# Patient Record
Sex: Female | Born: 1980
Health system: Southern US, Community
[De-identification: ages and names within clinical notes are randomized; demographics above are authoritative.]

## PROBLEM LIST (undated history)

## (undated) DIAGNOSIS — Z789 Other specified health status: Secondary | ICD-10-CM

## (undated) DIAGNOSIS — N979 Female infertility, unspecified: Secondary | ICD-10-CM

## (undated) HISTORY — PX: TONSILLECTOMY AND ADENOIDECTOMY: SUR1326

## (undated) HISTORY — PX: WISDOM TOOTH EXTRACTION: SHX21

## (undated) HISTORY — PX: CYST REMOVAL TRUNK: SHX6283

---

## 2014-01-04 ENCOUNTER — Encounter (INDEPENDENT_AMBULATORY_CARE_PROVIDER_SITE_OTHER): Payer: Self-pay | Admitting: General Surgery

## 2014-01-19 ENCOUNTER — Ambulatory Visit (INDEPENDENT_AMBULATORY_CARE_PROVIDER_SITE_OTHER): Payer: Commercial Managed Care - PPO | Admitting: General Surgery

## 2014-02-07 ENCOUNTER — Encounter (INDEPENDENT_AMBULATORY_CARE_PROVIDER_SITE_OTHER): Payer: Self-pay | Admitting: General Surgery

## 2014-02-07 ENCOUNTER — Ambulatory Visit (INDEPENDENT_AMBULATORY_CARE_PROVIDER_SITE_OTHER): Payer: Commercial Managed Care - PPO | Admitting: General Surgery

## 2014-02-07 VITALS — BP 136/70 | HR 107 | Temp 98.9°F | Ht 70.0 in | Wt 217.0 lb

## 2014-02-07 DIAGNOSIS — R222 Localized swelling, mass and lump, trunk: Secondary | ICD-10-CM | POA: Insufficient documentation

## 2014-02-07 DIAGNOSIS — R229 Localized swelling, mass and lump, unspecified: Secondary | ICD-10-CM

## 2014-02-07 NOTE — Progress Notes (Signed)
Patient ID: Stephanie Delgado, female   DOB: [birth year], 33 y.o.   MRN: [id number]  Chief Complaint  Patient presents with  . Cyst    HPI Stephanie Delgado is a 33 y.o. female.  HPI 33 year old Caucasian female referred by Dr. Leo Grosser for evaluation of an upper back mass. The patient states it is been present for least 10 years however it is slowly been increasing in size. It has never been infected or drained any fluid. However she is concerned by the fact it is increasing in size. She did have a cyst removed from the right side of her face as a small child. She denies any weight change. She denies any lymphadenopathy. She denies any fevers or chills. She denies any trauma to the area. She states that her grandparents both had lumps that were removed over the course of many years. To her knowledge they were nonmalignant Past Medical History  Diagnosis Date  . Hypertension   . Thyroid disease     Past Surgical History  Procedure Laterality Date  . Tonsillectomy and adenoidectomy    . Wisdom tooth extraction      History reviewed. No pertinent family history.  Social History History  Substance Use Topics  . Smoking status: Never Smoker   . Smokeless tobacco: Not on file  . Alcohol Use: No    No Known Allergies  Current Outpatient Prescriptions  Medication Sig Dispense Refill  . levothyroxine (SYNTHROID, LEVOTHROID) 25 MCG tablet Take 25 mcg by mouth daily before breakfast. Per Pt Dosage unknown      . Inositol-Folic Acid (PREGNITUDE) 2000-200 MG-MCG PACK Take by mouth.      . Prenatal Vit-Fe Fumarate-FA (PRENATAL MULTIVITAMIN) TABS tablet Take 1 tablet by mouth daily at 12 noon.       No current facility-administered medications for this visit.    Review of Systems Review of Systems  Constitutional: Negative for fever, activity change, appetite change and unexpected weight change.  HENT: Negative for nosebleeds and trouble swallowing.   Eyes: Negative for photophobia and  visual disturbance.  Respiratory: Negative for chest tightness and shortness of breath.   Cardiovascular: Negative for chest pain and leg swelling.       Denies CP, SOB, DOE  Genitourinary: Negative for dysuria and difficulty urinating.  Musculoskeletal: Negative for arthralgias.  Skin: Negative for pallor and rash.  Neurological: Negative for dizziness, seizures, facial asymmetry and numbness.          Hematological: Negative for adenopathy. Does not bruise/bleed easily.  Psychiatric/Behavioral: Negative for behavioral problems and agitation.    Blood pressure 136/70, pulse 107, temperature 98.9 F (37.2 C), temperature source Oral, height 5\' 10"  (1.778 m), weight 217 lb (98.431 kg), SpO2 98.00%.  Physical Exam Physical Exam  Vitals reviewed. Constitutional: She is oriented to person, place, and time. She appears well-developed and well-nourished. No distress.  HENT:  Head: Normocephalic and atraumatic.  Right Ear: External ear normal.  Left Ear: External ear normal.  Eyes: Conjunctivae are normal. No scleral icterus.  Neck: Normal range of motion. Neck supple. No tracheal deviation present. No thyromegaly present.  Cardiovascular: Normal rate and normal heart sounds.   Pulmonary/Chest: Effort normal and breath sounds normal. No stridor. No respiratory distress. She has no wheezes.    Upper back subcutaneous mass 3 cm x 2 cm, well-circumscribed, soft, nontender, no cellulitis, induration, or fluctuance. Mobile  Musculoskeletal: She exhibits no edema and no tenderness.  Lymphadenopathy:    She has no cervical  adenopathy.  Neurological: She is alert and oriented to person, place, and time. She exhibits normal muscle tone.  Skin: Skin is warm and dry. No rash noted. She is not diaphoretic. No erythema.  Psychiatric: She has a normal mood and affect. Her behavior is normal. Judgment and thought content normal.    Data Reviewed None   Assessment    Upper back subcutaneous  mass      Plan    This is most consistent with either a sebaceous cyst or lipoma. Irregardless it is a benign lesion.  We discussed the etiology and management of sebaceous cysts (epidermoid inclusion cysts) & lipomas. The patient was given educational material. We discussed that these lesions can become infected at times. We discussed the signs and symptoms of infection. We discussed the only way to eliminate these lesions is to surgically excise the cyst and its wall in its entirety.   We discussed observation versus surgical excision. We discussed the risks and benefits of surgery including but not limited to bleeding, infection, injury to surrounding structures, scarring, cosmetic concerns, possible temporary drain placement, blood clot formation, anesthesia issues, possible recurrence, and the typical postoperative course.   The patient has elected to to have the area excised. Our office schedule she will contact her to schedule surgery in the near future  Note: This dictation was prepared with Dragon/digital dictation along with [employer]. Any transcriptional errors that result from this process are unintentional.  Leighton Ruff. Redmond Pulling, MD, FACS General, Bariatric, & Minimally Invasive Surgery [facility], [place]          [facility] M 02/07/2014, 4:37 PM

## 2014-02-07 NOTE — Patient Instructions (Signed)
Our office will contact you to schedule surgery in the next few days. Please call if my office has not contacted you by Friday  Lipoma A lipoma is a noncancerous (benign) tumor composed of fat cells. They are usually found under the skin (subcutaneous). A lipoma may occur in any tissue of the body that contains fat. Common areas for lipomas to appear include the back, shoulders, buttocks, and thighs. Lipomas are a very common soft tissue growth. They are soft and grow slowly. Most problems caused by a lipoma depend on where it is growing. DIAGNOSIS  A lipoma can be diagnosed with a physical exam. These tumors rarely become cancerous, but radiographic studies can help determine this for certain. Studies used may include:  Computerized X-ray scans (CT or CAT scan).  Computerized magnetic scans (MRI). TREATMENT  Small lipomas that are not causing problems may be watched. If a lipoma continues to enlarge or causes problems, removal is often the best treatment. Lipomas can also be removed to improve appearance. Surgery is done to remove the fatty cells and the surrounding capsule. Most often, this is done with medicine that numbs the area (local anesthetic). The removed tissue is examined under a microscope to make sure it is not cancerous. Keep all follow-up appointments with your caregiver. SEEK MEDICAL CARE IF:   The lipoma becomes larger or hard.  The lipoma becomes painful, red, or increasingly swollen. These could be signs of infection or a more serious condition. Document Released: 06/07/2002 Document Revised: 09/09/2011 Document Reviewed: 11/17/2009 [facility] Patient Information 2015 [place], [place]. This information is not intended to replace advice given to you by your health care provider. Make sure you discuss any questions you have with your health care provider.   Epidermal Cyst An epidermal cyst is sometimes called a sebaceous cyst, epidermal inclusion cyst, or infundibular cyst. These  cysts usually contain a substance that looks "pasty" or "cheesy" and may have a bad smell. This substance is a protein called keratin. Epidermal cysts are usually found on the face, neck, or trunk. They may also occur in the vaginal area or other parts of the genitalia of both men and women. Epidermal cysts are usually small, painless, slow-growing bumps or lumps that move freely under the skin. It is important not to try to pop them. This may cause an infection and lead to tenderness and swelling. CAUSES  Epidermal cysts may be caused by a deep penetrating injury to the skin or a plugged hair follicle, often associated with acne. SYMPTOMS  Epidermal cysts can become inflamed and cause:  Redness.  Tenderness.  Increased temperature of the skin over the bumps or lumps.  Grayish-white, bad smelling material that drains from the bump or lump. DIAGNOSIS  Epidermal cysts are easily diagnosed by your caregiver during an exam. Rarely, a tissue sample (biopsy) may be taken to rule out other conditions that may resemble epidermal cysts. TREATMENT   Epidermal cysts often get better and disappear on their own. They are rarely ever cancerous.  If a cyst becomes infected, it may become inflamed and tender. This may require opening and draining the cyst. Treatment with antibiotics may be necessary. When the infection is gone, the cyst may be removed with minor surgery.  Small, inflamed cysts can often be treated with antibiotics or by injecting steroid medicines.  Sometimes, epidermal cysts become large and bothersome. If this happens, surgical removal in your caregiver's office may be necessary. HOME CARE INSTRUCTIONS  Only take over-the-counter or prescription medicines  as directed by your caregiver.  Take your antibiotics as directed. Finish them even if you start to feel better. SEEK MEDICAL CARE IF:   Your cyst becomes tender, red, or swollen.  Your condition is not improving or is getting  worse.  You have any other questions or concerns. MAKE SURE YOU:  Understand these instructions.  Will watch your condition.  Will get help right away if you are not doing well or get worse. Document Released: 05/18/2004 Document Revised: 09/09/2011 Document Reviewed: 12/24/2010 [facility] Patient Information 2015 [place], [place]. This information is not intended to replace advice given to you by your health care provider. Make sure you discuss any questions you have with your health care provider.

## 2014-04-04 ENCOUNTER — Other Ambulatory Visit (HOSPITAL_COMMUNITY): Payer: Self-pay | Admitting: Obstetrics and Gynecology

## 2014-04-04 DIAGNOSIS — N979 Female infertility, unspecified: Secondary | ICD-10-CM

## 2014-04-07 ENCOUNTER — Ambulatory Visit (HOSPITAL_COMMUNITY)
Admission: RE | Admit: 2014-04-07 | Discharge: 2014-04-07 | Disposition: A | Discharge status: Home / Self Care | Payer: 59 | Source: Ambulatory Visit | Attending: Obstetrics and Gynecology | Admitting: Obstetrics and Gynecology

## 2014-04-07 ENCOUNTER — Encounter (INDEPENDENT_AMBULATORY_CARE_PROVIDER_SITE_OTHER): Payer: Self-pay

## 2014-04-07 DIAGNOSIS — N979 Female infertility, unspecified: Secondary | ICD-10-CM | POA: Diagnosis present

## 2014-04-07 MED ORDER — IOHEXOL 300 MG/ML  SOLN
20.0000 mL | Freq: Once | INTRAMUSCULAR | Status: AC | PRN
  Administered 2014-04-07: 20 mL

## 2015-07-04 DIAGNOSIS — R070 Pain in throat: Secondary | ICD-10-CM | POA: Diagnosis not present

## 2015-07-04 DIAGNOSIS — K219 Gastro-esophageal reflux disease without esophagitis: Secondary | ICD-10-CM | POA: Diagnosis not present

## 2015-07-04 DIAGNOSIS — Z91018 Allergy to other foods: Secondary | ICD-10-CM | POA: Diagnosis not present

## 2015-07-11 DIAGNOSIS — E538 Deficiency of other specified B group vitamins: Secondary | ICD-10-CM | POA: Diagnosis not present

## 2015-08-08 DIAGNOSIS — E538 Deficiency of other specified B group vitamins: Secondary | ICD-10-CM | POA: Diagnosis not present

## 2015-08-21 MED FILL — SAXENDA 18 MG/3 ML PEN: 18 | 84 days supply | Qty: 45 | Fill #1

## 2015-08-29 DIAGNOSIS — E538 Deficiency of other specified B group vitamins: Secondary | ICD-10-CM | POA: Diagnosis not present

## 2015-08-31 DIAGNOSIS — E538 Deficiency of other specified B group vitamins: Secondary | ICD-10-CM | POA: Diagnosis not present

## 2015-08-31 DIAGNOSIS — R202 Paresthesia of skin: Secondary | ICD-10-CM | POA: Diagnosis not present

## 2015-08-31 DIAGNOSIS — M25561 Pain in right knee: Secondary | ICD-10-CM | POA: Diagnosis not present

## 2015-08-31 DIAGNOSIS — G8929 Other chronic pain: Secondary | ICD-10-CM | POA: Diagnosis not present

## 2015-08-31 DIAGNOSIS — F418 Other specified anxiety disorders: Secondary | ICD-10-CM | POA: Diagnosis not present

## 2015-08-31 DIAGNOSIS — E6609 Other obesity due to excess calories: Secondary | ICD-10-CM | POA: Diagnosis not present

## 2015-08-31 DIAGNOSIS — Z6831 Body mass index (BMI) 31.0-31.9, adult: Secondary | ICD-10-CM | POA: Diagnosis not present

## 2015-10-31 DIAGNOSIS — E538 Deficiency of other specified B group vitamins: Secondary | ICD-10-CM | POA: Diagnosis not present

## 2015-10-31 DIAGNOSIS — F419 Anxiety disorder, unspecified: Secondary | ICD-10-CM | POA: Diagnosis not present

## 2015-10-31 DIAGNOSIS — E6609 Other obesity due to excess calories: Secondary | ICD-10-CM | POA: Diagnosis not present

## 2015-10-31 DIAGNOSIS — M238X2 Other internal derangements of left knee: Secondary | ICD-10-CM | POA: Diagnosis not present

## 2015-11-13 MED FILL — CONTRAVE ER 8-90 MG TABLET: 8-90 | 30 days supply | Qty: 120 | Fill #0

## 2015-12-18 MED FILL — SAXENDA 18 MG/3 ML PEN: 18 | 84 days supply | Qty: 45 | Fill #2

## 2016-01-09 DIAGNOSIS — M25562 Pain in left knee: Secondary | ICD-10-CM | POA: Diagnosis not present

## 2016-01-09 DIAGNOSIS — E538 Deficiency of other specified B group vitamins: Secondary | ICD-10-CM | POA: Diagnosis not present

## 2016-01-09 DIAGNOSIS — G8929 Other chronic pain: Secondary | ICD-10-CM | POA: Diagnosis not present

## 2016-01-09 MED FILL — CYANOCOBALAMIN 1,000 MCG/ML: 1000 | 90 days supply | Qty: 3 | Fill #0

## 2016-01-09 MED FILL — TERBINAFINE HCL 250 MG TAB: 250 | 30 days supply | Qty: 30 | Fill #0

## 2016-01-11 DIAGNOSIS — M25561 Pain in right knee: Secondary | ICD-10-CM | POA: Diagnosis not present

## 2016-01-11 DIAGNOSIS — M25562 Pain in left knee: Secondary | ICD-10-CM | POA: Diagnosis not present

## 2016-01-11 DIAGNOSIS — S83412A Sprain of medial collateral ligament of left knee, initial encounter: Secondary | ICD-10-CM | POA: Diagnosis not present

## 2016-01-11 MED FILL — METHYLPREDNISOLONE 4 MG TAB: 4 | 6 days supply | Qty: 21 | Fill #0

## 2016-01-13 DIAGNOSIS — H6123 Impacted cerumen, bilateral: Secondary | ICD-10-CM | POA: Diagnosis not present

## 2016-01-13 DIAGNOSIS — H9313 Tinnitus, bilateral: Secondary | ICD-10-CM | POA: Diagnosis not present

## 2016-02-05 DIAGNOSIS — Z79899 Other long term (current) drug therapy: Secondary | ICD-10-CM | POA: Diagnosis not present

## 2016-02-05 DIAGNOSIS — E538 Deficiency of other specified B group vitamins: Secondary | ICD-10-CM | POA: Diagnosis not present

## 2016-03-11 DIAGNOSIS — E538 Deficiency of other specified B group vitamins: Secondary | ICD-10-CM | POA: Diagnosis not present

## 2016-03-12 MED FILL — SAXENDA 18 MG/3 ML PEN: 18 | 84 days supply | Qty: 45 | Fill #3

## 2016-03-15 DIAGNOSIS — D171 Benign lipomatous neoplasm of skin and subcutaneous tissue of trunk: Secondary | ICD-10-CM | POA: Diagnosis not present

## 2016-03-28 DIAGNOSIS — L814 Other melanin hyperpigmentation: Secondary | ICD-10-CM | POA: Diagnosis not present

## 2016-03-28 DIAGNOSIS — D225 Melanocytic nevi of trunk: Secondary | ICD-10-CM | POA: Diagnosis not present

## 2016-03-28 DIAGNOSIS — L700 Acne vulgaris: Secondary | ICD-10-CM | POA: Diagnosis not present

## 2016-04-10 DIAGNOSIS — E538 Deficiency of other specified B group vitamins: Secondary | ICD-10-CM | POA: Diagnosis not present

## 2016-04-10 DIAGNOSIS — Z6829 Body mass index (BMI) 29.0-29.9, adult: Secondary | ICD-10-CM | POA: Diagnosis not present

## 2016-04-10 DIAGNOSIS — R208 Other disturbances of skin sensation: Secondary | ICD-10-CM | POA: Diagnosis not present

## 2016-04-10 DIAGNOSIS — B351 Tinea unguium: Secondary | ICD-10-CM | POA: Diagnosis not present

## 2016-04-10 DIAGNOSIS — E669 Obesity, unspecified: Secondary | ICD-10-CM | POA: Diagnosis not present

## 2016-05-27 MED FILL — SAXENDA 18 MG/3 ML PEN: 18 | 84 days supply | Qty: 45 | Fill #4

## 2016-05-30 DIAGNOSIS — L723 Sebaceous cyst: Secondary | ICD-10-CM | POA: Diagnosis not present

## 2016-05-30 DIAGNOSIS — L720 Epidermal cyst: Secondary | ICD-10-CM | POA: Diagnosis not present

## 2016-07-11 DIAGNOSIS — E669 Obesity, unspecified: Secondary | ICD-10-CM | POA: Diagnosis not present

## 2016-07-11 DIAGNOSIS — E538 Deficiency of other specified B group vitamins: Secondary | ICD-10-CM | POA: Diagnosis not present

## 2016-07-16 DIAGNOSIS — E538 Deficiency of other specified B group vitamins: Secondary | ICD-10-CM | POA: Diagnosis not present

## 2016-07-16 DIAGNOSIS — E669 Obesity, unspecified: Secondary | ICD-10-CM | POA: Diagnosis not present

## 2016-07-16 DIAGNOSIS — R634 Abnormal weight loss: Secondary | ICD-10-CM | POA: Diagnosis not present

## 2016-09-23 DIAGNOSIS — M6740 Ganglion, unspecified site: Secondary | ICD-10-CM | POA: Diagnosis not present

## 2017-04-16 MED FILL — diazePAM 5 MG TABS: 5 | 10 days supply | Qty: 10 | Fill #0

## 2017-04-29 DIAGNOSIS — N979 Female infertility, unspecified: Secondary | ICD-10-CM | POA: Diagnosis not present

## 2017-04-29 DIAGNOSIS — N926 Irregular menstruation, unspecified: Secondary | ICD-10-CM | POA: Diagnosis not present

## 2017-04-29 DIAGNOSIS — Z3040 Encounter for surveillance of contraceptives, unspecified: Secondary | ICD-10-CM | POA: Diagnosis not present

## 2017-05-09 DIAGNOSIS — N926 Irregular menstruation, unspecified: Secondary | ICD-10-CM | POA: Diagnosis not present

## 2017-05-09 DIAGNOSIS — N979 Female infertility, unspecified: Secondary | ICD-10-CM | POA: Diagnosis not present

## 2017-05-09 DIAGNOSIS — Z331 Pregnant state, incidental: Secondary | ICD-10-CM | POA: Diagnosis not present

## 2017-05-09 DIAGNOSIS — O3680X1 Pregnancy with inconclusive fetal viability, fetus 1: Secondary | ICD-10-CM | POA: Diagnosis not present

## 2017-05-09 DIAGNOSIS — Z3A01 Less than 8 weeks gestation of pregnancy: Secondary | ICD-10-CM | POA: Diagnosis not present

## 2017-05-29 DIAGNOSIS — Z113 Encounter for screening for infections with a predominantly sexual mode of transmission: Secondary | ICD-10-CM | POA: Diagnosis not present

## 2017-05-29 DIAGNOSIS — Z3491 Encounter for supervision of normal pregnancy, unspecified, first trimester: Secondary | ICD-10-CM | POA: Diagnosis not present

## 2017-05-29 LAB — OB RESULTS CONSOLE HIV ANTIBODY (ROUTINE TESTING): HIV: NONREACTIVE

## 2017-05-29 LAB — OB RESULTS CONSOLE HEPATITIS B SURFACE ANTIGEN: HEP B S AG: NEGATIVE

## 2017-05-29 LAB — OB RESULTS CONSOLE RPR: RPR: NONREACTIVE

## 2017-05-29 LAB — OB RESULTS CONSOLE GC/CHLAMYDIA
Chlamydia: NEGATIVE
Gonorrhea: NEGATIVE

## 2017-05-29 LAB — OB RESULTS CONSOLE RUBELLA ANTIBODY, IGM: Rubella: IMMUNE

## 2017-05-29 LAB — OB RESULTS CONSOLE ANTIBODY SCREEN: Antibody Screen: NEGATIVE

## 2017-05-29 LAB — OB RESULTS CONSOLE ABO/RH: RH TYPE: POSITIVE

## 2017-06-17 DIAGNOSIS — Z3491 Encounter for supervision of normal pregnancy, unspecified, first trimester: Secondary | ICD-10-CM | POA: Diagnosis not present

## 2017-06-17 DIAGNOSIS — Z3A12 12 weeks gestation of pregnancy: Secondary | ICD-10-CM | POA: Diagnosis not present

## 2017-06-17 DIAGNOSIS — Z3481 Encounter for supervision of other normal pregnancy, first trimester: Secondary | ICD-10-CM | POA: Diagnosis not present

## 2017-06-17 DIAGNOSIS — Z36 Encounter for antenatal screening for chromosomal anomalies: Secondary | ICD-10-CM | POA: Diagnosis not present

## 2017-06-17 DIAGNOSIS — O3680X9 Pregnancy with inconclusive fetal viability, other fetus: Secondary | ICD-10-CM | POA: Diagnosis not present

## 2017-07-16 DIAGNOSIS — Z3A16 16 weeks gestation of pregnancy: Secondary | ICD-10-CM | POA: Diagnosis not present

## 2017-07-16 DIAGNOSIS — R631 Polydipsia: Secondary | ICD-10-CM | POA: Diagnosis not present

## 2017-08-15 DIAGNOSIS — Z369 Encounter for antenatal screening, unspecified: Secondary | ICD-10-CM | POA: Diagnosis not present

## 2017-08-15 DIAGNOSIS — O43129 Velamentous insertion of umbilical cord, unspecified trimester: Secondary | ICD-10-CM | POA: Diagnosis not present

## 2017-08-15 DIAGNOSIS — Z3491 Encounter for supervision of normal pregnancy, unspecified, first trimester: Secondary | ICD-10-CM | POA: Diagnosis not present

## 2017-08-15 DIAGNOSIS — Z3A20 20 weeks gestation of pregnancy: Secondary | ICD-10-CM | POA: Diagnosis not present

## 2017-08-15 DIAGNOSIS — Z3402 Encounter for supervision of normal first pregnancy, second trimester: Secondary | ICD-10-CM | POA: Diagnosis not present

## 2017-08-15 DIAGNOSIS — Z363 Encounter for antenatal screening for malformations: Secondary | ICD-10-CM | POA: Diagnosis not present

## 2017-09-12 DIAGNOSIS — O43129 Velamentous insertion of umbilical cord, unspecified trimester: Secondary | ICD-10-CM | POA: Diagnosis not present

## 2017-09-12 DIAGNOSIS — Z3A24 24 weeks gestation of pregnancy: Secondary | ICD-10-CM | POA: Diagnosis not present

## 2017-10-14 DIAGNOSIS — Z3403 Encounter for supervision of normal first pregnancy, third trimester: Secondary | ICD-10-CM | POA: Diagnosis not present

## 2017-10-14 DIAGNOSIS — Z3A29 29 weeks gestation of pregnancy: Secondary | ICD-10-CM | POA: Diagnosis not present

## 2017-10-14 DIAGNOSIS — O43129 Velamentous insertion of umbilical cord, unspecified trimester: Secondary | ICD-10-CM | POA: Diagnosis not present

## 2017-10-28 DIAGNOSIS — Z3A31 31 weeks gestation of pregnancy: Secondary | ICD-10-CM | POA: Diagnosis not present

## 2017-10-28 DIAGNOSIS — Z23 Encounter for immunization: Secondary | ICD-10-CM | POA: Diagnosis not present

## 2017-10-28 DIAGNOSIS — Z3493 Encounter for supervision of normal pregnancy, unspecified, third trimester: Secondary | ICD-10-CM | POA: Diagnosis not present

## 2017-10-29 ENCOUNTER — Other Ambulatory Visit (HOSPITAL_COMMUNITY): Payer: Self-pay | Admitting: Obstetrics and Gynecology

## 2017-10-29 DIAGNOSIS — Z3689 Encounter for other specified antenatal screening: Secondary | ICD-10-CM

## 2017-11-12 ENCOUNTER — Encounter (HOSPITAL_COMMUNITY): Payer: Self-pay | Admitting: *Deleted

## 2017-11-13 ENCOUNTER — Other Ambulatory Visit (HOSPITAL_COMMUNITY): Payer: Self-pay | Admitting: Obstetrics and Gynecology

## 2017-11-13 ENCOUNTER — Ambulatory Visit (HOSPITAL_COMMUNITY)
Admission: RE | Admit: 2017-11-13 | Discharge: 2017-11-13 | Disposition: A | Discharge status: Home / Self Care | Payer: 59 | Source: Ambulatory Visit | Attending: Obstetrics and Gynecology | Admitting: Obstetrics and Gynecology

## 2017-11-13 DIAGNOSIS — Z363 Encounter for antenatal screening for malformations: Secondary | ICD-10-CM

## 2017-11-13 DIAGNOSIS — O0933 Supervision of pregnancy with insufficient antenatal care, third trimester: Secondary | ICD-10-CM

## 2017-11-13 DIAGNOSIS — Z3689 Encounter for other specified antenatal screening: Secondary | ICD-10-CM

## 2017-11-13 DIAGNOSIS — Z3A33 33 weeks gestation of pregnancy: Secondary | ICD-10-CM

## 2017-11-13 DIAGNOSIS — Z362 Encounter for other antenatal screening follow-up: Secondary | ICD-10-CM | POA: Insufficient documentation

## 2017-11-13 IMAGING — US US MFM OB COMP +14 WKS
1 series · 14 of 28 positions shown · non-contrast
Comparison: none

[Series 1: us mfm ob comp +14 wks · 71 acquisitions, 14 frames shown]
[im 3/71]
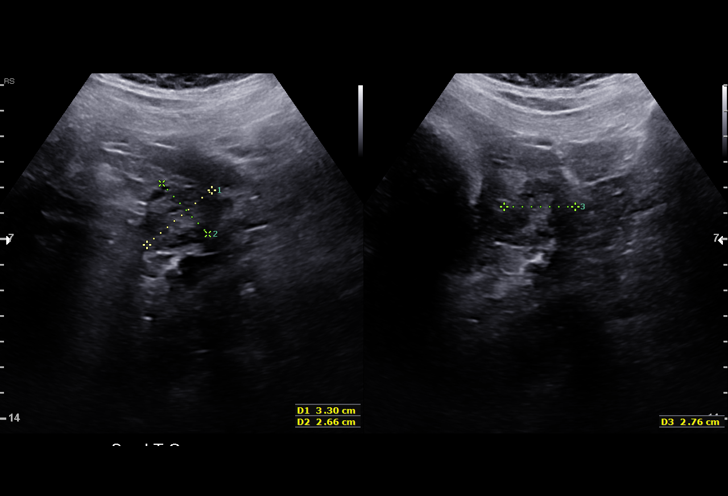
[im 8/71]
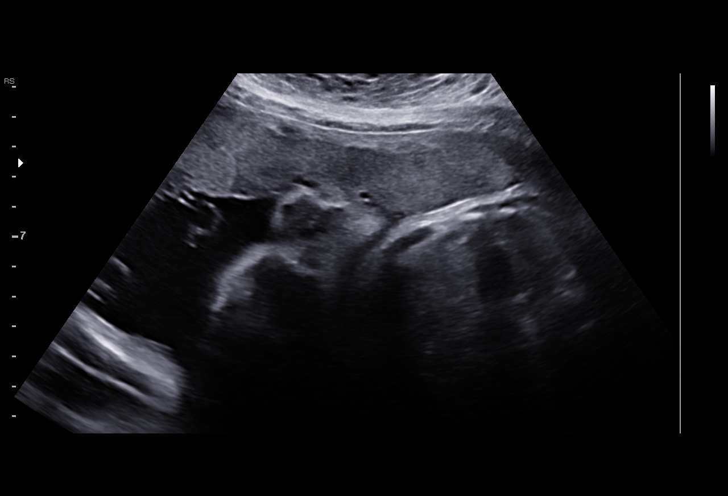
[im 13/71]
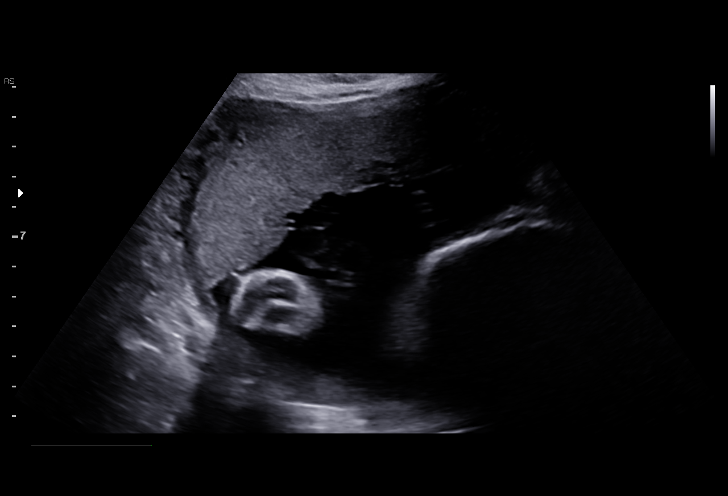
[im 19/71]
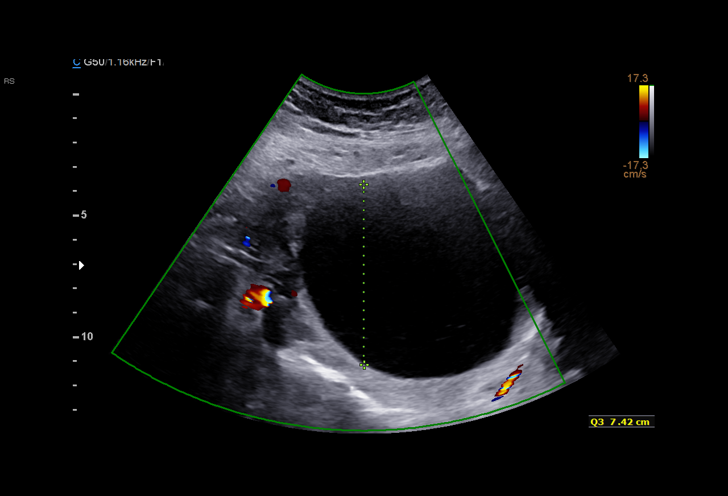
[im 24/71]
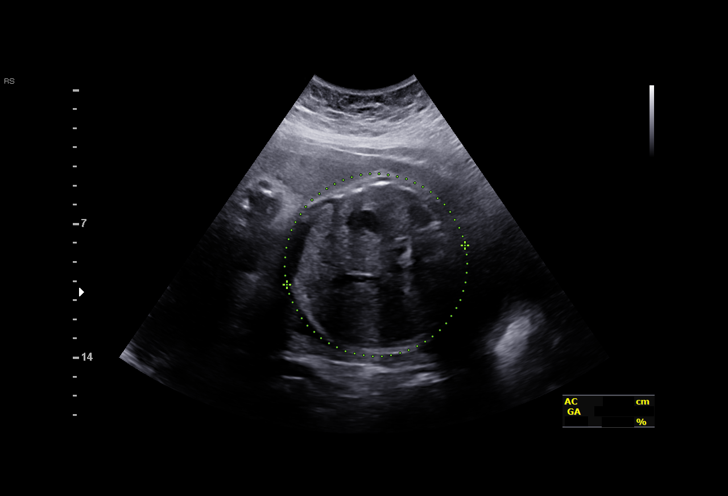
[im 29/71]
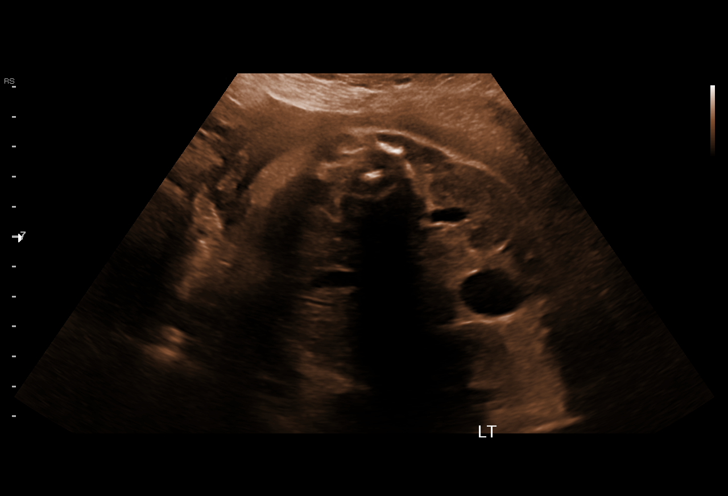
[im 34/71]
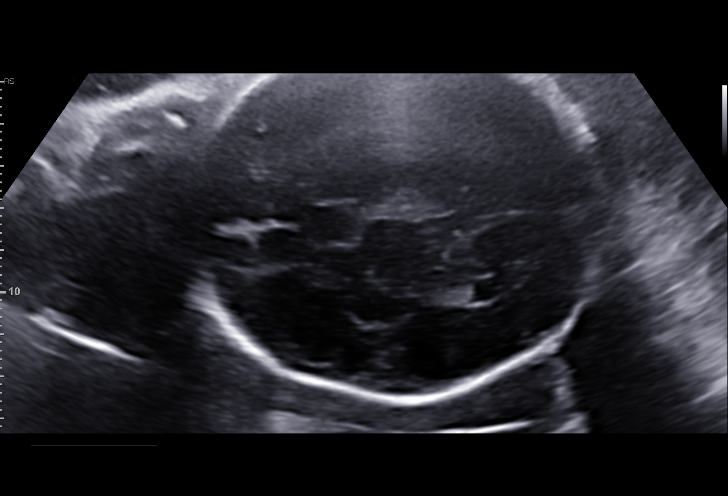
[im 39/71]
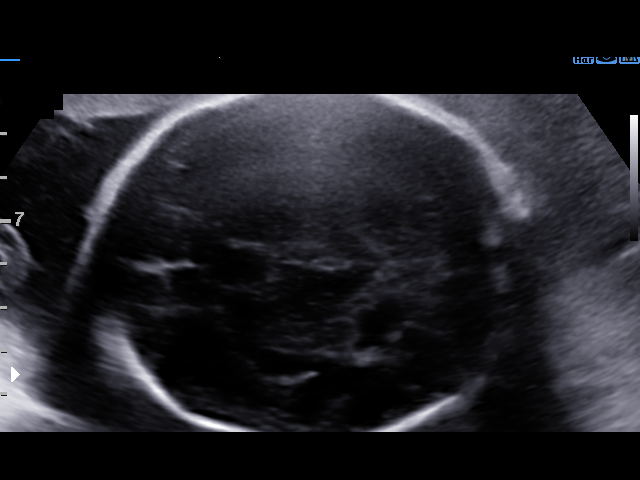
[im 45/71]
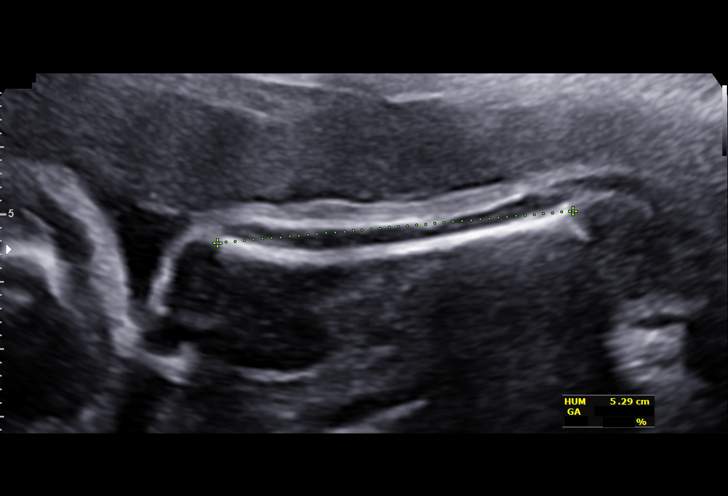
[im 50/71]
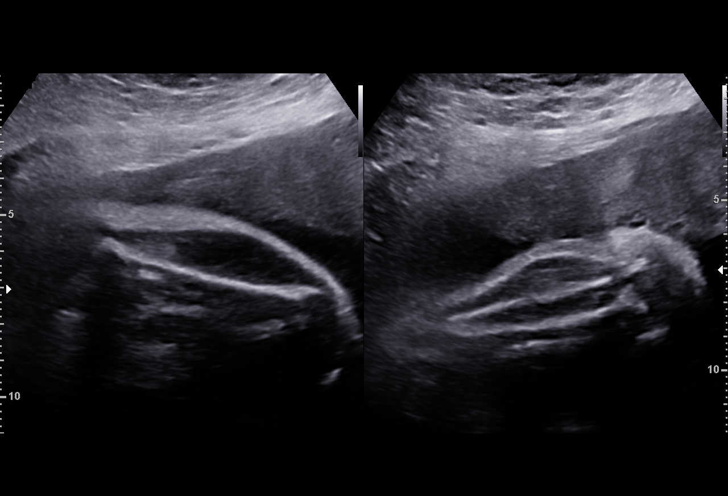
[im 55/71]
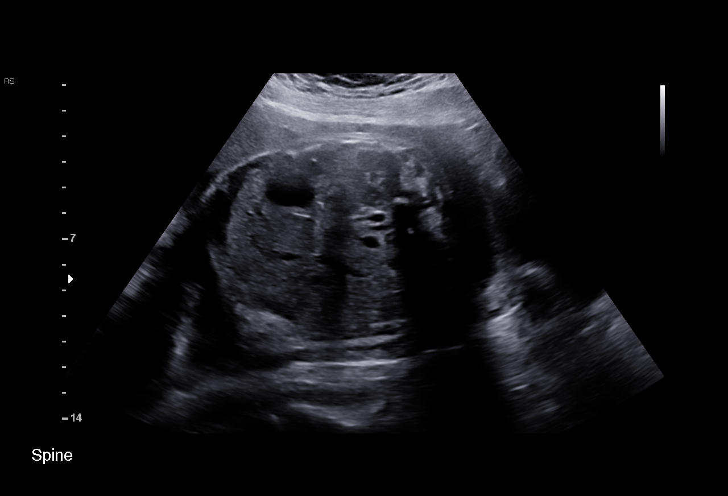
[im 60/71]
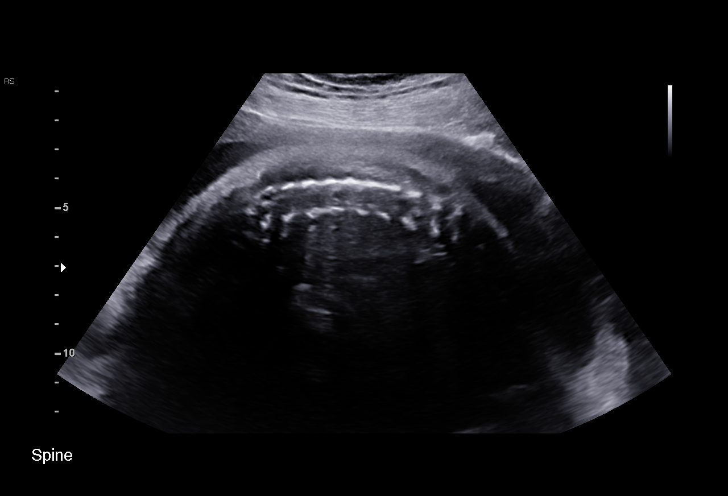
[im 65/71]
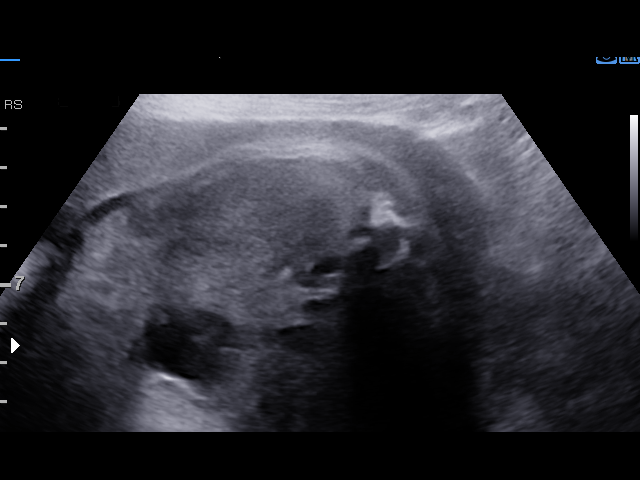
[im 71/71]
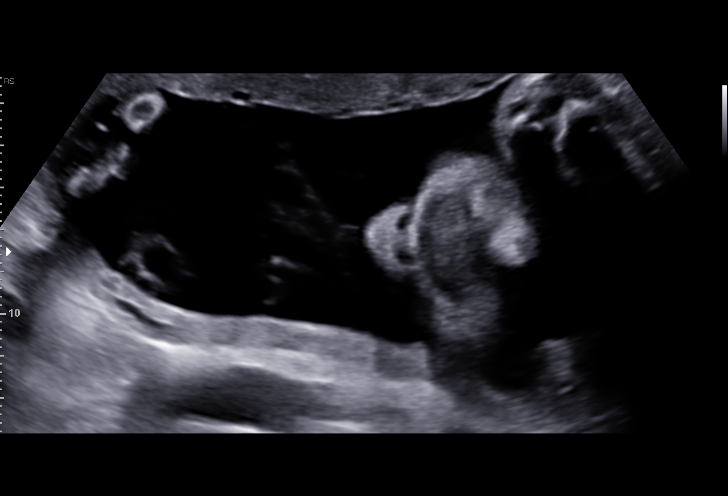

[14 of 28 positions shown; findings below may reference images not displayed]

Obstetrics &
Gynecology
[9H] ICHUUN
ICHUUN.

1  ICHUUN            [PHONE_NUMBER]      [PHONE_NUMBER]     [PHONE_NUMBER]
Indications

33 weeks gestation of pregnancy
OB History

Gravidity:    1
Fetal Evaluation

Num Of Fetuses:     1
Fetal Heart         154
Rate(bpm):
Cardiac Activity:   Observed
Presentation:       Transverse, head to maternal left
Placenta:           Anterior, above cervical os
P. Cord Insertion:  Visualized

Amniotic Fluid
AFI FV:      Subjectively within normal limits

AFI Sum(cm)     %Tile       Largest Pocket(cm)
19.19           71

RUQ(cm)       RLQ(cm)       LUQ(cm)        LLQ(cm)
3.34
Biometry
BPD:      84.3  mm     G. Age:  33w 6d         65  %    CI:        77.24   %    70 - 86
FL/HC:      19.9   %    19.9 -
HC:      303.7  mm     G. Age:  33w 5d         25  %    HC/AC:      1.01        0.96 -
AC:      301.9  mm     G. Age:  34w 1d         76  %    FL/BPD:     71.5   %    71 - 87
FL:       60.3  mm     G. Age:  31w 2d          5  %    FL/AC:      20.0   %    20 - 24
HUM:      52.7  mm     G. Age:  30w 5d          6  %
CER:      42.7  mm     G. Age:  36w 5d         88  %

Est. FW:    [9H]  gm    4 lb 12 oz      59  %
Gestational Age

LMP:           33w 2d        Date:  [DATE]                 EDD:   [DATE]
U/S Today:     33w 2d                                        EDD:   [DATE]
Best:          33w 2d     Det. By:  LMP  ([DATE])          EDD:   [DATE]
Anatomy

Cranium:               Appears normal         Aortic Arch:            Appears normal
Cavum:                 Appears normal         Ductal Arch:            Not well visualized
Ventricles:            Appears normal         Diaphragm:              Not well visualized
Choroid Plexus:        Not well visualized    Stomach:                Appears normal, left
sided
Cerebellum:            Appears normal         Abdomen:                Appears normal
Posterior Fossa:       Not well visualized    Abdominal Wall:         Not well visualized
Nuchal Fold:           Not applicable (>20    Cord Vessels:           Appears normal (3
wks GA)                                        vessel cord)
Face:                  Not well visualized    Kidneys:                Appear normal
Lips:                  Appears normal         Bladder:                Appears normal
Thoracic:              Appears normal         Spine:                  Not well visualized
Heart:                 Not well visualized    Upper Extremities:      LUE vis, RUE not
seen
RVOT:                  Not well visualized    Lower Extremities:      Visualized
LVOT:                  Not well visualized

Other:  Male gender. Technically difficult due to fetal position. Technically
difficult due to advanced gestational age.
Cervix Uterus Adnexa

Cervix
Normal appearance by transabdominal scan.

Uterus
No abnormality visualized.

Left Ovary
Within normal limits.

Right Ovary
Within normal limits.

Adnexa:       No abnormality visualized. No adnexal mass
visualized.
Impression

Singleton intrauterine pregnancy at 33+2 weeks here for
anatomic survey
Review of the anatomy shows no sonographic markers for
aneuploidy or structural anomalies
However, views of the posterior fossa, fetal heart and spine
should be considered suboptimal secondary to late gestation
and fetal position
Amniotic fluid volume is normal
Estimated fetal weight shows growth in the 59th percentile
Recommendations

Recommend follow-up ultrasound examination in 4 weeks for
completion of the anatomic survey

## 2017-11-17 DIAGNOSIS — Z3482 Encounter for supervision of other normal pregnancy, second trimester: Secondary | ICD-10-CM | POA: Diagnosis not present

## 2017-11-17 DIAGNOSIS — Z3483 Encounter for supervision of other normal pregnancy, third trimester: Secondary | ICD-10-CM | POA: Diagnosis not present

## 2017-11-28 DIAGNOSIS — Z369 Encounter for antenatal screening, unspecified: Secondary | ICD-10-CM | POA: Diagnosis not present

## 2017-11-28 LAB — OB RESULTS CONSOLE GBS: GBS: NEGATIVE

## 2017-12-05 DIAGNOSIS — O43129 Velamentous insertion of umbilical cord, unspecified trimester: Secondary | ICD-10-CM | POA: Diagnosis not present

## 2017-12-05 DIAGNOSIS — Z3A36 36 weeks gestation of pregnancy: Secondary | ICD-10-CM | POA: Diagnosis not present

## 2017-12-30 ENCOUNTER — Encounter (HOSPITAL_COMMUNITY): Payer: Self-pay | Admitting: *Deleted

## 2017-12-30 ENCOUNTER — Telehealth (HOSPITAL_COMMUNITY): Payer: Self-pay | Admitting: *Deleted

## 2017-12-30 ENCOUNTER — Other Ambulatory Visit: Payer: Self-pay | Admitting: Obstetrics & Gynecology

## 2017-12-30 DIAGNOSIS — Z3A40 40 weeks gestation of pregnancy: Secondary | ICD-10-CM | POA: Diagnosis not present

## 2017-12-30 DIAGNOSIS — O480 Post-term pregnancy: Secondary | ICD-10-CM | POA: Diagnosis not present

## 2017-12-30 NOTE — Telephone Encounter (Signed)
Preadmission screen  

## 2018-01-06 ENCOUNTER — Encounter (HOSPITAL_COMMUNITY): Payer: Self-pay

## 2018-01-06 ENCOUNTER — Inpatient Hospital Stay (HOSPITAL_COMMUNITY)
Admission: AD | Admit: 2018-01-06 | Discharge: 2018-01-10 | DRG: 787 | Disposition: A | Discharge status: Home / Self Care | Payer: 59 | Attending: Obstetrics & Gynecology | Admitting: Obstetrics & Gynecology

## 2018-01-06 ENCOUNTER — Inpatient Hospital Stay (HOSPITAL_COMMUNITY): Payer: 59 | Admitting: Anesthesiology

## 2018-01-06 ENCOUNTER — Other Ambulatory Visit: Payer: Self-pay

## 2018-01-06 DIAGNOSIS — D62 Acute posthemorrhagic anemia: Secondary | ICD-10-CM | POA: Diagnosis not present

## 2018-01-06 DIAGNOSIS — O134 Gestational [pregnancy-induced] hypertension without significant proteinuria, complicating childbirth: Secondary | ICD-10-CM | POA: Diagnosis present

## 2018-01-06 DIAGNOSIS — O9081 Anemia of the puerperium: Secondary | ICD-10-CM | POA: Diagnosis not present

## 2018-01-06 DIAGNOSIS — Z3A41 41 weeks gestation of pregnancy: Secondary | ICD-10-CM

## 2018-01-06 DIAGNOSIS — O411230 Chorioamnionitis, third trimester, not applicable or unspecified: Secondary | ICD-10-CM | POA: Diagnosis not present

## 2018-01-06 DIAGNOSIS — O480 Post-term pregnancy: Secondary | ICD-10-CM | POA: Diagnosis not present

## 2018-01-06 LAB — COMPREHENSIVE METABOLIC PANEL
ALBUMIN: 2.6 g/dL — AB (ref 3.5–5.0)
ALT: 12 U/L (ref 0–44)
ANION GAP: 9 (ref 5–15)
AST: 17 U/L (ref 15–41)
Alkaline Phosphatase: 157 U/L — ABNORMAL HIGH (ref 38–126)
BILIRUBIN TOTAL: 0.5 mg/dL (ref 0.3–1.2)
BUN: 6 mg/dL (ref 6–20)
CO2: 22 mmol/L (ref 22–32)
Calcium: 8.6 mg/dL — ABNORMAL LOW (ref 8.9–10.3)
Chloride: 103 mmol/L (ref 98–111)
Creatinine, Ser: 0.59 mg/dL (ref 0.44–1.00)
GFR calc non Af Amer: >60 mL/min (ref >60)
Glucose, Bld: 80 mg/dL (ref 70–99)
POTASSIUM: 3.8 mmol/L (ref 3.5–5.1)
SODIUM: 134 mmol/L — AB (ref 135–145)
TOTAL PROTEIN: 6 g/dL — AB (ref 6.5–8.1)

## 2018-01-06 LAB — CBC
HCT: 35.3 % — ABNORMAL LOW (ref 36.0–46.0)
Hemoglobin: 12.2 g/dL (ref 12.0–15.0)
MCH: 29.3 pg (ref 26.0–34.0)
MCHC: 34.6 g/dL (ref 30.0–36.0)
MCV: 84.9 fL (ref 78.0–100.0)
Platelets: 171 10*3/uL (ref 150–400)
RBC: 4.16 MIL/uL (ref 3.87–5.11)
RDW: 13.9 % (ref 11.5–15.5)
WBC: 12.4 10*3/uL — ABNORMAL HIGH (ref 4.0–10.5)

## 2018-01-06 LAB — RPR: RPR Ser Ql: NONREACTIVE

## 2018-01-06 LAB — TYPE AND SCREEN
ABO/RH(D): O POS
ANTIBODY SCREEN: NEGATIVE

## 2018-01-06 LAB — PROTEIN / CREATININE RATIO, URINE: CREATININE, URINE: 50 mg/dL

## 2018-01-06 LAB — ABO/RH: ABO/RH(D): O POS

## 2018-01-06 MED ORDER — OXYTOCIN BOLUS FROM INFUSION: 500.0000 mL | Freq: Once | INTRAVENOUS | Status: DC

## 2018-01-06 MED ORDER — LIDOCAINE HCL (PF) 1 % IJ SOLN
INTRAMUSCULAR | Status: DC | PRN
  Administered 2018-01-06: 6 mL via EPIDURAL
  Administered 2018-01-06: 4 mL via EPIDURAL

## 2018-01-06 MED ORDER — LACTATED RINGERS IV SOLN: 500.0000 mL | INTRAVENOUS | Status: DC | PRN

## 2018-01-06 MED ORDER — MISOPROSTOL 25 MCG QUARTER TABLET
25.0000 ug | ORAL_TABLET | ORAL | Status: DC | PRN
  Administered 2018-01-06 (×2): 25 ug via VAGINAL
  Filled 2018-01-06 (×2): qty 1

## 2018-01-06 MED ORDER — LACTATED RINGERS IV SOLN
INTRAVENOUS | Status: DC
  Administered 2018-01-06 – 2018-01-07 (×3): via INTRAVENOUS

## 2018-01-06 MED ORDER — PHENYLEPHRINE 40 MCG/ML (10ML) SYRINGE FOR IV PUSH (FOR BLOOD PRESSURE SUPPORT)
80.0000 ug | PREFILLED_SYRINGE | INTRAVENOUS | Status: DC | PRN
  Filled 2018-01-06: qty 10

## 2018-01-06 MED ORDER — LIDOCAINE HCL (PF) 1 % IJ SOLN: 30.0000 mL | INTRAMUSCULAR | Status: DC | PRN

## 2018-01-06 MED ORDER — SOD CITRATE-CITRIC ACID 500-334 MG/5ML PO SOLN
30.0000 mL | ORAL | Status: DC | PRN
  Administered 2018-01-06 – 2018-01-07 (×2): 30 mL via ORAL
  Filled 2018-01-06 (×3): qty 15

## 2018-01-06 MED ORDER — LACTATED RINGERS IV SOLN
INTRAVENOUS | Status: DC
  Administered 2018-01-06 (×2): via INTRAUTERINE

## 2018-01-06 MED ORDER — EPHEDRINE 5 MG/ML INJ: 10.0000 mg | INTRAVENOUS | Status: DC | PRN

## 2018-01-06 MED ORDER — ONDANSETRON HCL 4 MG/2ML IJ SOLN: 4.0000 mg | Freq: Four times a day (QID) | INTRAMUSCULAR | Status: DC | PRN

## 2018-01-06 MED ORDER — LACTATED RINGERS IV SOLN
500.0000 mL | Freq: Once | INTRAVENOUS | Status: AC
  Administered 2018-01-06: 500 mL via INTRAVENOUS

## 2018-01-06 MED ORDER — OXYTOCIN 40 UNITS IN LACTATED RINGERS INFUSION - SIMPLE MED
1.0000 m[IU]/min | INTRAVENOUS | Status: DC
  Filled 2018-01-06: qty 1000

## 2018-01-06 MED ORDER — OXYTOCIN 40 UNITS IN LACTATED RINGERS INFUSION - SIMPLE MED
1.0000 m[IU]/min | INTRAVENOUS | Status: DC
  Administered 2018-01-06: 2 m[IU]/min via INTRAVENOUS
  Administered 2018-01-07: 10 m[IU]/min via INTRAVENOUS

## 2018-01-06 MED ORDER — DIPHENHYDRAMINE HCL 50 MG/ML IJ SOLN: 12.5000 mg | INTRAMUSCULAR | Status: DC | PRN

## 2018-01-06 MED ORDER — FLEET ENEMA 7-19 GM/118ML RE ENEM: 1.0000 | ENEMA | RECTAL | Status: DC | PRN

## 2018-01-06 MED ORDER — OXYTOCIN 40 UNITS IN LACTATED RINGERS INFUSION - SIMPLE MED: 2.5000 [IU]/h | INTRAVENOUS | Status: DC

## 2018-01-06 MED ORDER — MENTHOL 3 MG MT LOZG
1.0000 | LOZENGE | OROMUCOSAL | Status: DC | PRN
  Filled 2018-01-06: qty 9

## 2018-01-06 MED ORDER — FENTANYL CITRATE (PF) 100 MCG/2ML IJ SOLN: 50.0000 ug | INTRAMUSCULAR | Status: DC | PRN

## 2018-01-06 MED ORDER — FENTANYL 2.5 MCG/ML BUPIVACAINE 1/10 % EPIDURAL INFUSION (WH - ANES)
14.0000 mL/h | INTRAMUSCULAR | Status: DC | PRN
  Administered 2018-01-06 (×2): 14 mL/h via EPIDURAL
  Filled 2018-01-06 (×3): qty 100

## 2018-01-06 MED ORDER — ACETAMINOPHEN 325 MG PO TABS
650.0000 mg | ORAL_TABLET | ORAL | Status: DC | PRN
  Administered 2018-01-06: 650 mg via ORAL
  Filled 2018-01-06: qty 2

## 2018-01-06 MED ORDER — TERBUTALINE SULFATE 1 MG/ML IJ SOLN: 0.2500 mg | Freq: Once | INTRAMUSCULAR | Status: DC | PRN

## 2018-01-06 MED ORDER — PHENYLEPHRINE 40 MCG/ML (10ML) SYRINGE FOR IV PUSH (FOR BLOOD PRESSURE SUPPORT): 80.0000 ug | PREFILLED_SYRINGE | INTRAVENOUS | Status: DC | PRN

## 2018-01-06 NOTE — Anesthesia Preprocedure Evaluation (Signed)
Anesthesia Evaluation  Patient identified by MRN, date of birth, ID band Patient awake    Reviewed: Allergy & Precautions, NPO status , Patient's Chart, lab work & pertinent test results  History of Anesthesia Complications Negative for: history of anesthetic complications  Airway Mallampati: II  TM Distance: >3 FB Neck ROM: Full    Dental  (+) Dental Advisory Given   Pulmonary neg pulmonary ROS,    breath sounds clear to auscultation       Cardiovascular negative cardio ROS   Rhythm:Regular Rate:Normal     Neuro/Psych negative neurological ROS  negative psych ROS   GI/Hepatic negative GI ROS, Neg liver ROS,   Endo/Other  negative endocrine ROSObesity   Renal/GU negative Renal ROS  negative genitourinary   Musculoskeletal negative musculoskeletal ROS (+)   Abdominal (+) + obese,   Peds  Hematology  (+) anemia ,   Anesthesia Other Findings   Reproductive/Obstetrics (+) Pregnancy                             Anesthesia Physical Anesthesia Plan  ASA: II  Anesthesia Plan: Epidural   Post-op Pain Management:    Induction:   PONV Risk Score and Plan:   Airway Management Planned: Natural Airway  Additional Equipment: None  Intra-op Plan:   Post-operative Plan:   Informed Consent: I have reviewed the patients History and Physical, chart, labs and discussed the procedure including the risks, benefits and alternatives for the proposed anesthesia with the patient or authorized representative who has indicated his/her understanding and acceptance.     Plan Discussed with: Anesthesiologist  Anesthesia Plan Comments: (Labs reviewed. Platelets acceptable, patient not taking any blood thinning medications. Risks and benefits discussed with patient, patient expressed understanding and wished to proceed.)        Anesthesia Quick Evaluation

## 2018-01-06 NOTE — Anesthesia Procedure Notes (Signed)
Epidural Patient location during procedure: OB Start time: 01/06/2018 3:20 PM End time: 01/06/2018 3:24 PM  Staffing Anesthesiologist: Audry Pili, MD Performed: anesthesiologist   Preanesthetic Checklist Completed: patient identified, pre-op evaluation, timeout performed, IV checked, risks and benefits discussed and monitors and equipment checked  Epidural Patient position: sitting Prep: DuraPrep Patient monitoring: continuous pulse ox and blood pressure Approach: midline Location: L2-L3 Injection technique: LOR saline  Needle:  Needle type: Tuohy  Needle gauge: 17 G Needle length: 9 cm Needle insertion depth: 7 cm Catheter size: 19 Gauge Catheter at skin depth: 12 cm Test dose: negative and Other (1% lidocaine)  Additional Notes Patient identified. Risks including, but not limited to, bleeding, infection, nerve damage, paralysis, inadequate analgesia, blood pressure changes, nausea, vomiting, allergic reaction, postpartum back pain, itching, and headache were discussed. Patient expressed understanding and wished to proceed. Sterile prep and drape, including hand hygiene, mask, and sterile gloves were used. The patient was positioned and the spine was prepped. The skin was anesthetized with lidocaine. No paraesthesia or other complication noted. The patient did not experience any signs of intravascular injection such as tinnitus or metallic taste in mouth, nor signs of intrathecal spread such as rapid motor block. Please see nursing notes for vital signs. The patient tolerated the procedure well.   Renold Don, MDReason for block:procedure for pain

## 2018-01-06 NOTE — Progress Notes (Addendum)
Labor Progress Note  Subjective: Stephanie Delgado, 37 y.o., G1P0, with an IUP @ [redacted]w[redacted]d, presented for  induction of labor complicated by marginal insertion of umbilical cord. Pt resting in bed with husband at bedside. Pt aware of plan of care and induction process and denies any questions.    I discussed with patient risks, benefits and alternatives of labor induction including higher risk of cesarean delivery compared to spontaneous labor.  We discussed risks of induction agents including effects on fetal heart beat, contraction pattern and need for close monitoring.  Patient expressed understanding of all this and desired to proceed with the induction. Risks and benefits of induction were reviewed, including failure of method, prolonged labor, need for further intervention, risk of cesarean.  Patient and family verbalized understanding and denies any further questions at this time. Pt and family wish to proceed with induction process. Discussed induction options of Cytotec, foley bulb, AROM, and pitocin were reviewed as well as risks and benefits with use of each discussed.  Patient Active Problem List   Diagnosis Date Noted  . Pregnancy, post-term 01/06/2018  . Mass on back 02/07/2014    Objective: BP (!) 146/94   Pulse (!) 104   Temp 98.5 F (36.9 C) (Oral)   Resp 16   Ht 5\' 10"  (1.778 m)   Wt 111.6 kg (246 lb)   LMP 03/25/2017   BMI 35.30 kg/m  No intake/output data recorded. No intake/output data recorded. NST: FHR baseline 130 bpm, Variability: absent, Accelerations:present, Decelerations:  Absent= Cat 1/Reactive CTX:  none Uterus gravid, soft non tender, moderate to palpate with contractions.  SVE:  Dilation: 2 Effacement (%): 50 Station: -3 Exam by:: Annalaura Sauseda,cnm Pitocin at 11mUn/min (will start now) Attempted foley bulb placement with sterile speculum and failed.   Vertex verified by leopold's and bedside US.   Assessment:  Stephanie Delgado, 64 y.o., G1P0, with an IUP @  [redacted]w[redacted]d, presented for  induction of labor complicated by marginal insertion of umbilical cord Patient Active Problem List   Diagnosis Date Noted  . Pregnancy, post-term 01/06/2018  . Mass on back 02/07/2014   NICHD: Category 1  Membranes:  Intact  MVUs (No IUPC)  Induction:    Cytotec x2 at 0100 & 0500  Foley Bulb: insertion: failed  Pitocin - will start now at 2X2  Pain management:                Epidural placement: When pt requires;  at () on ()  GBS Negative  Plan: Continue labor plan Continuous/intermittent monitoring Rest Ambulate Frequent position changes to facilitate fetal rotation and descent. Will reassess with cervical exam at 1300 or earlier if necessary Start pitocin per protocol 2X2 Anticipate labor progression and vaginal delivery.   Md Pinn verbalized plan to start pitocin now and  aware of plan and verbalized agreement.   Noralyn Pick, NP-C, CNM, MSN 01/06/2018. 10:03 AM

## 2018-01-06 NOTE — Progress Notes (Signed)
Labor Progress Note  Subjective: Stephanie Delgado, 37 y.o., G1P0, with an IUP @ [redacted]w[redacted]d, presented for  induction of labor complicated by marginal insertion of umbilical cord. Pt aware of plan of care and induction process and denies any questions.  Pt was sleeping in bed with family at bedside.   Patient Active Problem List   Diagnosis Date Noted  . Pregnancy, post-term 01/06/2018  . Mass on back 02/07/2014    Objective: BP (!) 154/98   Pulse 98   Temp 98.2 F (36.8 C) (Oral)   Resp 16   Ht 5\' 10"  (1.778 m)   Wt 111.6 kg (246 lb)   LMP 03/25/2017   BMI 35.30 kg/m  No intake/output data recorded. No intake/output data recorded. NST: FHR baseline 130 bpm, Variability: absent, Accelerations:present, Decelerations:  Absent= Cat 1/Reactive There have been x3 late decels with moderate variability that return to 130s baseline with position change.  CTX:  Regular Q2 mins lasting 60 seconds and mild to palpate.  Uterus gravid, soft non tender, moderate to palpate with contractions.  SVE:  Dilation: 2 Effacement (%): 50 Station: -2 Exam by:: pinn Pitocin at 8 mUn/min (will start now)  Dr Alwyn Pea AROM, moderate meconium present, infant and mother tolerated well.  IUPC placed.  MVU: 230  Assessment:  Stephanie Delgado, 37 y.o., G1P0, with an IUP @ [redacted]w[redacted]d, presented for  induction of labor complicated by marginal insertion of umbilical cord Patient Active Problem List   Diagnosis Date Noted  . Pregnancy, post-term 01/06/2018  . Mass on back 02/07/2014   NICHD: Category 1  Membranes:  AROM , mod mec @ 1400, no s/sx of infection  MVUs: 230, Just placed 10 mins ago.  Induction:    Cytotec x2 at 0100 & 0500  Foley Bulb: insertion: failed  Pitocin - at 8 now  Pain management:                Epidural placement: When pt requires;  at () on ()  GBS Negative  Plan: Continue labor plan Continuous/intermittent monitoring Rest Ambulate Frequent position changes to facilitate fetal  rotation and descent. Will reassess with cervical exam at 1800 or earlier if necessary Continue pitocin titration per protocol 2X2 Anticipate labor progression and vaginal delivery.   Md Pinn verbalized plan to start pitocin now and  aware of plan and verbalized agreement.   Noralyn Pick, NP-C, CNM, MSN 01/06/2018. 2:13 PM

## 2018-01-06 NOTE — Progress Notes (Signed)
Labor Progress Note  Subjective: Stephanie Delgado, 37 y.o., G1P0, with an IUP @ [redacted]w[redacted]d, presented for  induction of labor complicated by marginal insertion of umbilical cord. Pt aware of plan of care and induction process and denies any questions.    Patient comfortable now with epidural.   Patient Active Problem List   Diagnosis Date Noted  . Pregnancy, post-term 01/06/2018  . Mass on back 02/07/2014    Objective: BP (!) 146/86   Pulse 82   Temp 98.6 F (37 C) (Oral)   Resp 18   Ht 5\' 10"  (1.778 m)   Wt 246 lb (111.6 kg)   LMP 03/25/2017   SpO2 100%   BMI 35.30 kg/m  No intake/output data recorded. Total I/O In: 803.7 [I.V.:803.7] Out: 575 [Urine:575]   NST: FHR baseline 135 bpm, moderate variability Accelerations:present several repetitive late decels nadir 90's with return to baseline. With maternal change in position to left side there was improvement in the tracing and decelerations ceased.    TOCO:  Ctx q3-4 mins lasting 60 sec Uterus gravid, soft non tender, moderate to palpate with contractions.  SVE:  Dilation: 4 Effacement (%): 60 Station: -1 Exam by:: Dr. Alwyn Pea Pitocin at 13 mU FSE in place IUPC in place MVU: 130  Assessment:  Stephanie Delgado, 37 y.o., G1P0, with an IUP @ [redacted]w[redacted]d, presented for  induction of labor for post dates pregnancy complicated by marginal insertion of umbilical cord  Patient Active Problem List   Diagnosis Date Noted  . Pregnancy, post-term 01/06/2018  . Mass on back 02/07/2014   NICHD: Category 2  Membranes:  AROM , mod mec @ 1400, no s/sx of infection  MVUs: 130 IUPC in place  Induction:    Cytotec x2 at 0100 & 0500  Foley Bulb: insertion: failed  Pitocin - 61mU restarted   Pain management:                Epidural   GBS Negative  Plan: Continue labor plan Discussed FHR with patient again, tracing more reassuring with maternal  Position change.    Will closely monitor with FSE in place  Will reassess with  cervical exam if necessary patient in Latent phase  Unknown mode of delivery at this time  Stephanie Delgado, [place]  01/06/2018. 10:05 PM

## 2018-01-06 NOTE — H&P (Signed)
Stephanie Delgado is a 37 y.o. femaleG1P0 @ [redacted]w[redacted]d  presenting for induction of labor complicated by marginal insertion of umbilical cord OB History    Gravida  1   Para      Term      Preterm      AB      Living        SAB      TAB      Ectopic      Multiple      Live Births             No past medical history on file. Past Surgical History:  Procedure Laterality Date  . CYST REMOVAL TRUNK     back  . TONSILLECTOMY AND ADENOIDECTOMY    . WISDOM TOOTH EXTRACTION     Family History: family history includes Diabetes in her maternal grandmother; Hypertension in her maternal grandmother; Thrombosis in her maternal grandmother. Social History:  reports that she has never smoked. She has never used smokeless tobacco. She reports that she does not drink alcohol or use drugs.     Maternal Diabetes: No Genetic Screening: Normal Maternal Ultrasounds/Referrals: Abnormal:  Findings:   Other: Fetal Ultrasounds or other Referrals:  Other: marginal insertion of umbilical cord Maternal Substance Abuse:  No Significant Maternal Medications:  None Significant Maternal Lab Results:  Lab values include: Group B Strep negative Other Comments:  None  Review of Systems  All other systems reviewed and are negative.  Maternal Medical History:  Contractions: Frequency: rare.    Fetal activity: Perceived fetal activity is normal.   Last perceived fetal movement was within the past hour.    Prenatal Complications - Diabetes: none.      Last menstrual period 03/25/2017. Maternal Exam:  Uterine Assessment: Contraction frequency is rare.   Abdomen: Patient reports no abdominal tenderness. Fetal presentation: vertex  Introitus: Normal vulva. Normal vagina.  Pelvis: questionable for delivery.      Fetal Exam Fetal Monitor Review: Mode: ultrasound.   Variability: moderate (6-25 bpm).   Pattern: accelerations present.    Fetal State Assessment: Category I - tracings are  normal.     Physical Exam  Nursing note and vitals reviewed. Constitutional: She is oriented to person, place, and time. She appears well-developed and well-nourished.  HENT:  Head: Normocephalic.  Neck: Normal range of motion.  Cardiovascular: Normal rate and regular rhythm.  Respiratory: Effort normal and breath sounds normal.  GI: Soft. There is no tenderness.  Genitourinary: Vagina normal.  Musculoskeletal: Normal range of motion.  Neurological: She is alert and oriented to person, place, and time.  Skin: Skin is warm and dry.  Psychiatric: She has a normal mood and affect. Her behavior is normal. Judgment and thought content normal.    Prenatal labs: ABO, Rh: O/Positive/-- (11/29 0000) Antibody: Negative (11/29 0000) Rubella: Immune (11/29 0000) RPR: Nonreactive (11/29 0000)  HBsAg: Negative (11/29 0000)  HIV: Non-reactive (11/29 0000)  GBS: Negative (05/31 0000)   Assessment/Plan: [place] Postdate Induction Marginal insertion of Umbilical cord  Admit L/d Cervical Ripening with cytotec Routine orders Anticipate Vaginal Delivery  Lori A Clemmons 01/06/2018, 12:45 AM

## 2018-01-06 NOTE — Progress Notes (Signed)
Labor Progress Note  Subjective: TYKERA SKATES, 37 y.o., G1P0, with an IUP @ [redacted]w[redacted]d, presented for  induction of labor complicated by marginal insertion of umbilical cord. Pt aware of plan of care and induction process and denies any questions.    Patient comfortable now with epidural. Came to bedside for Fetal deceleration nadir 70s lasting 1 minute with return to baseline of 130.   Patient Active Problem List   Diagnosis Date Noted  . Pregnancy, post-term 01/06/2018  . Mass on back 02/07/2014    Objective: BP 128/81   Pulse (!) 126   Temp 97.9 F (36.6 C) (Oral)   Resp 18   Ht 5\' 10"  (1.778 m)   Wt 246 lb (111.6 kg)   LMP 03/25/2017   SpO2 100%   BMI 35.30 kg/m  No intake/output data recorded. No intake/output data recorded.   NST: FHR baseline 130 bpm, Variability: minimal Accelerations:absent Decelerations:  Three late decelerations in the past 30 minutes nadir 70s lasting 1 minute with return to baseline Cat II   CTX:  Regular Q2 mins lasting 60 seconds and mild to palpate.  Uterus gravid, soft non tender, moderate to palpate with contractions.  SVE:  Dilation: 3 Effacement (%): 50 Station: -2 Exam by:: Illene Sweeting Pitocin stopped FSE placed IUPC in place MVU: 260  Assessment:  [name], 37 y.o., G1P0, with an IUP @ [redacted]w[redacted]d, presented for  induction of labor for post dates pregnancy complicated by marginal insertion of umbilical cord  Patient Active Problem List   Diagnosis Date Noted  . Pregnancy, post-term 01/06/2018  . Mass on back 02/07/2014   NICHD: Category 2  Membranes:  AROM , mod mec @ 1400, no s/sx of infection  MVUs: 260 IUPC in place  Induction:    Cytotec x2 at 0100 & 0500  Foley Bulb: insertion: failed  Pitocin - held  Pain management:                Epidural   GBS Negative  Plan: Continue labor plan Discussed FHR with patient and spouse. Will start amnioinfusion as there was not  A lot of fluid upon AROM.  Will bolus 300 and  infuse at 150/hr.  Discussed if FHB continues To show signs of distress will proceed with a primary cesarean delivery.  Patient voiced understanding and agrees.  Will closely monitor with FSE in place  Frequent position changes to facilitate fetal rotation and descent. Will reassess with cervical exam if necessary patient in Latent phase  Continue pitocin titration if FHT is reassuring in 20 minutes  Unknown mode of delivery at this time  Caffie Damme  01/06/2018. 5:23 PM

## 2018-01-06 NOTE — Progress Notes (Addendum)
Labor Progress Note  Subjective: TERRACE CHIEM, 37 y.o., G1P0, with an IUP @ [redacted]w[redacted]d, presented for  induction of labor complicated by marginal insertion of umbilical cord. Pt resting in bed with husband at bedside. Pt aware of plan of care and induction process and denies any questions.  Pt endorses starting to feel some contractions, but not described as painful.   Patient Active Problem List   Diagnosis Date Noted  . Pregnancy, post-term 01/06/2018  . Mass on back 02/07/2014    Objective: BP (!) 158/88   Pulse (!) 101   Temp 98.2 F (36.8 C) (Oral)   Resp 18   Ht 5\' 10"  (1.778 m)   Wt 111.6 kg (246 lb)   LMP 03/25/2017   BMI 35.30 kg/m  No intake/output data recorded. No intake/output data recorded. NST: FHR baseline 130 bpm, Variability: absent, Accelerations:present, Decelerations:  Absent= Cat 1/Reactive CTX:  none Uterus gravid, soft non tender, moderate to palpate with contractions.  SVE:  Dilation: 2 Effacement (%): 50 Station: -2 Exam by:: Foley,rn Pitocin at 8 mUn/min (will start now)   Assessment:  RHETA HEMMELGARN, 37 y.o., G1P0, with an IUP @ [redacted]w[redacted]d, presented for  induction of labor complicated by marginal insertion of umbilical cord Patient Active Problem List   Diagnosis Date Noted  . Pregnancy, post-term 01/06/2018  . Mass on back 02/07/2014   NICHD: Category 1  Membranes:  Intact  MVUs (No IUPC)  Induction:    Cytotec x2 at 0100 & 0500  Foley Bulb: insertion: failed  Pitocin - at 8 now  Pain management:                Epidural placement: When pt requires;  at () on ()  GBS Negative  Plan: Continue labor plan Continuous/intermittent monitoring Rest Ambulate Frequent position changes to facilitate fetal rotation and descent. Will reassess with cervical exam at 1300 or earlier if necessary Continue pitocin titration per protocol 2X2 Anticipate labor progression and vaginal delivery.   Md Pinn verbalized plan to start pitocin now and   aware of plan and verbalized agreement.   Noralyn Pick, NP-C, CNM, MSN 01/06/2018. 1:28 PM

## 2018-01-06 NOTE — Anesthesia Pain Management Evaluation Note (Signed)
  CRNA Pain Management Visit Note  Patient: Stephanie Delgado, 37 y.o., female  "Hello I am a member of the anesthesia team at [facility]. We have an anesthesia team available at all times to provide care throughout the hospital, including epidural management and anesthesia for C-section. I don't know your plan for the delivery whether it a natural birth, water birth, IV sedation, nitrous supplementation, doula or epidural, but we want to meet your pain goals."   1.Was your pain managed to your expectations on prior hospitalizations?   No prior hospitalizations 2.What is your expectation for pain management during this hospitalization?     Epidural  3.How can we help you reach that goal? Epidural when desired  Record the patient's initial score and the patient's pain goal.   Pain: 2 Pain Goal:5 [facility] wants you to be able to say your pain was always managed very well.  Sua Spadafora 01/06/2018

## 2018-01-07 ENCOUNTER — Encounter (HOSPITAL_COMMUNITY): Payer: Self-pay

## 2018-01-07 ENCOUNTER — Encounter (HOSPITAL_COMMUNITY)
Admission: AD | Disposition: A | Discharge status: Home / Self Care | Payer: Self-pay | Source: Home / Self Care | Attending: Obstetrics & Gynecology

## 2018-01-07 SURGERY — Surgical Case
Anesthesia: Epidural

## 2018-01-07 MED ORDER — COCONUT OIL OIL
1.0000 "application " | TOPICAL_OIL | Status: DC | PRN
  Administered 2018-01-09: 1 via TOPICAL
  Filled 2018-01-07: qty 120

## 2018-01-07 MED ORDER — SODIUM CHLORIDE 0.9% FLUSH: 3.0000 mL | INTRAVENOUS | Status: DC | PRN

## 2018-01-07 MED ORDER — NIFEDIPINE ER OSMOTIC RELEASE 30 MG PO TB24
30.0000 mg | ORAL_TABLET | Freq: Every day | ORAL | Status: DC
  Administered 2018-01-07 – 2018-01-10 (×4): 30 mg via ORAL
  Filled 2018-01-07 (×4): qty 1

## 2018-01-07 MED ORDER — SENNOSIDES-DOCUSATE SODIUM 8.6-50 MG PO TABS
2.0000 | ORAL_TABLET | ORAL | Status: DC
  Administered 2018-01-08 – 2018-01-09 (×3): 2 via ORAL
  Filled 2018-01-07 (×3): qty 2

## 2018-01-07 MED ORDER — CHLOROPROCAINE HCL (PF) 3 % IJ SOLN
INTRAMUSCULAR | Status: AC
  Filled 2018-01-07: qty 20

## 2018-01-07 MED ORDER — SODIUM BICARBONATE 8.4 % IV SOLN
INTRAVENOUS | Status: AC
  Filled 2018-01-07: qty 50

## 2018-01-07 MED ORDER — HYDROCODONE-ACETAMINOPHEN 7.5-325 MG PO TABS: 1.0000 | ORAL_TABLET | Freq: Once | ORAL | Status: DC | PRN

## 2018-01-07 MED ORDER — WITCH HAZEL-GLYCERIN EX PADS: 1.0000 "application " | MEDICATED_PAD | CUTANEOUS | Status: DC | PRN

## 2018-01-07 MED ORDER — NALBUPHINE HCL 10 MG/ML IJ SOLN: 5.0000 mg | INTRAMUSCULAR | Status: DC | PRN

## 2018-01-07 MED ORDER — ZOLPIDEM TARTRATE 5 MG PO TABS: 5.0000 mg | ORAL_TABLET | Freq: Every evening | ORAL | Status: DC | PRN

## 2018-01-07 MED ORDER — NALOXONE HCL 0.4 MG/ML IJ SOLN: 0.4000 mg | INTRAMUSCULAR | Status: DC | PRN

## 2018-01-07 MED ORDER — DIPHENHYDRAMINE HCL 25 MG PO CAPS: 25.0000 mg | ORAL_CAPSULE | Freq: Four times a day (QID) | ORAL | Status: DC | PRN

## 2018-01-07 MED ORDER — ACETAMINOPHEN 10 MG/ML IV SOLN: 1000.0000 mg | Freq: Once | INTRAVENOUS | Status: DC | PRN

## 2018-01-07 MED ORDER — CHLOROPROCAINE HCL (PF) 3 % IJ SOLN
INTRAMUSCULAR | Status: DC | PRN
  Administered 2018-01-07: 600 mg

## 2018-01-07 MED ORDER — NALBUPHINE HCL 10 MG/ML IJ SOLN: 5.0000 mg | Freq: Once | INTRAMUSCULAR | Status: DC | PRN

## 2018-01-07 MED ORDER — SODIUM CHLORIDE 0.9 % IR SOLN
Status: DC | PRN
  Administered 2018-01-07: 1

## 2018-01-07 MED ORDER — PHENYLEPHRINE HCL 10 MG/ML IJ SOLN
INTRAMUSCULAR | Status: DC | PRN
  Administered 2018-01-07: 80 ug via INTRAVENOUS

## 2018-01-07 MED ORDER — CEFAZOLIN SODIUM-DEXTROSE 2-4 GM/100ML-% IV SOLN: 2.0000 g | INTRAVENOUS | Status: DC

## 2018-01-07 MED ORDER — MEPERIDINE HCL 25 MG/ML IJ SOLN: 6.2500 mg | INTRAMUSCULAR | Status: DC | PRN

## 2018-01-07 MED ORDER — CEFAZOLIN SODIUM-DEXTROSE 2-4 GM/100ML-% IV SOLN
INTRAVENOUS | Status: AC
  Filled 2018-01-07: qty 100

## 2018-01-07 MED ORDER — BUPIVACAINE HCL 0.5 % IJ SOLN
INTRAMUSCULAR | Status: DC | PRN
  Administered 2018-01-07: 30 mL

## 2018-01-07 MED ORDER — PROMETHAZINE HCL 25 MG/ML IJ SOLN: 6.2500 mg | INTRAMUSCULAR | Status: DC | PRN

## 2018-01-07 MED ORDER — SCOPOLAMINE 1 MG/3DAYS TD PT72
MEDICATED_PATCH | TRANSDERMAL | Status: AC
  Filled 2018-01-07: qty 1

## 2018-01-07 MED ORDER — DIBUCAINE 1 % RE OINT: 1.0000 "application " | TOPICAL_OINTMENT | RECTAL | Status: DC | PRN

## 2018-01-07 MED ORDER — KETOROLAC TROMETHAMINE 30 MG/ML IJ SOLN
30.0000 mg | Freq: Four times a day (QID) | INTRAMUSCULAR | Status: AC | PRN
  Administered 2018-01-07: 30 mg via INTRAMUSCULAR

## 2018-01-07 MED ORDER — FAMOTIDINE IN NACL 20-0.9 MG/50ML-% IV SOLN
20.0000 mg | Freq: Once | INTRAVENOUS | Status: AC
  Administered 2018-01-07: 20 mg via INTRAVENOUS
  Filled 2018-01-07: qty 50

## 2018-01-07 MED ORDER — ACETAMINOPHEN 325 MG PO TABS
650.0000 mg | ORAL_TABLET | ORAL | Status: DC | PRN
  Administered 2018-01-07: 650 mg via ORAL
  Filled 2018-01-07 (×5): qty 2

## 2018-01-07 MED ORDER — MORPHINE SULFATE (PF) 0.5 MG/ML IJ SOLN
INTRAMUSCULAR | Status: AC
  Filled 2018-01-07: qty 10

## 2018-01-07 MED ORDER — CEFAZOLIN SODIUM-DEXTROSE 2-3 GM-%(50ML) IV SOLR
INTRAVENOUS | Status: DC | PRN
  Administered 2018-01-07: 2 g via INTRAVENOUS

## 2018-01-07 MED ORDER — OXYCODONE-ACETAMINOPHEN 5-325 MG PO TABS
1.0000 | ORAL_TABLET | Freq: Four times a day (QID) | ORAL | Status: DC | PRN
  Administered 2018-01-07 – 2018-01-08 (×4): 2 via ORAL
  Administered 2018-01-08: 1 via ORAL
  Administered 2018-01-09 (×2): 2 via ORAL
  Administered 2018-01-09: 1 via ORAL
  Administered 2018-01-09 – 2018-01-10 (×3): 2 via ORAL
  Filled 2018-01-07 (×7): qty 2
  Filled 2018-01-07: qty 1
  Filled 2018-01-07 (×3): qty 2

## 2018-01-07 MED ORDER — PHENYLEPHRINE 40 MCG/ML (10ML) SYRINGE FOR IV PUSH (FOR BLOOD PRESSURE SUPPORT)
PREFILLED_SYRINGE | INTRAVENOUS | Status: AC
  Filled 2018-01-07: qty 10

## 2018-01-07 MED ORDER — SIMETHICONE 80 MG PO CHEW
80.0000 mg | CHEWABLE_TABLET | ORAL | Status: DC
  Administered 2018-01-08 – 2018-01-09 (×3): 80 mg via ORAL
  Filled 2018-01-07 (×3): qty 1

## 2018-01-07 MED ORDER — ONDANSETRON HCL 4 MG/2ML IJ SOLN: 4.0000 mg | Freq: Three times a day (TID) | INTRAMUSCULAR | Status: DC | PRN

## 2018-01-07 MED ORDER — NALOXONE HCL 4 MG/10ML IJ SOLN
1.0000 ug/kg/h | INTRAVENOUS | Status: DC | PRN
  Filled 2018-01-07: qty 5

## 2018-01-07 MED ORDER — ONDANSETRON HCL 4 MG/2ML IJ SOLN
INTRAMUSCULAR | Status: AC
  Filled 2018-01-07: qty 2

## 2018-01-07 MED ORDER — KETOROLAC TROMETHAMINE 30 MG/ML IJ SOLN
INTRAMUSCULAR | Status: AC
  Filled 2018-01-07: qty 1

## 2018-01-07 MED ORDER — IBUPROFEN 600 MG PO TABS
600.0000 mg | ORAL_TABLET | Freq: Four times a day (QID) | ORAL | Status: DC
  Administered 2018-01-08 – 2018-01-10 (×11): 600 mg via ORAL
  Filled 2018-01-07 (×12): qty 1

## 2018-01-07 MED ORDER — SCOPOLAMINE 1 MG/3DAYS TD PT72: 1.0000 | MEDICATED_PATCH | Freq: Once | TRANSDERMAL | Status: DC

## 2018-01-07 MED ORDER — LACTATED RINGERS IV SOLN
INTRAVENOUS | Status: DC | PRN
  Administered 2018-01-07: 07:00:00 via INTRAVENOUS

## 2018-01-07 MED ORDER — SCOPOLAMINE 1 MG/3DAYS TD PT72
MEDICATED_PATCH | TRANSDERMAL | Status: DC | PRN
  Administered 2018-01-07: 1 via TRANSDERMAL

## 2018-01-07 MED ORDER — PRENATAL MULTIVITAMIN CH
1.0000 | ORAL_TABLET | Freq: Every day | ORAL | Status: DC
  Administered 2018-01-07 – 2018-01-10 (×4): 1 via ORAL
  Filled 2018-01-07 (×4): qty 1

## 2018-01-07 MED ORDER — OXYTOCIN 40 UNITS IN LACTATED RINGERS INFUSION - SIMPLE MED: 2.5000 [IU]/h | INTRAVENOUS | Status: AC

## 2018-01-07 MED ORDER — FENTANYL CITRATE (PF) 100 MCG/2ML IJ SOLN
INTRAMUSCULAR | Status: DC | PRN
  Administered 2018-01-07: 50 ug via INTRAVENOUS
  Administered 2018-01-07: 50 ug via EPIDURAL

## 2018-01-07 MED ORDER — KETOROLAC TROMETHAMINE 30 MG/ML IJ SOLN
30.0000 mg | Freq: Four times a day (QID) | INTRAMUSCULAR | Status: AC | PRN
  Administered 2018-01-07: 30 mg via INTRAVENOUS
  Filled 2018-01-07: qty 1

## 2018-01-07 MED ORDER — SIMETHICONE 80 MG PO CHEW: 80.0000 mg | CHEWABLE_TABLET | ORAL | Status: DC | PRN

## 2018-01-07 MED ORDER — DEXAMETHASONE SODIUM PHOSPHATE 4 MG/ML IJ SOLN
INTRAMUSCULAR | Status: AC
  Filled 2018-01-07: qty 1

## 2018-01-07 MED ORDER — TETANUS-DIPHTH-ACELL PERTUSSIS 5-2.5-18.5 LF-MCG/0.5 IM SUSP: 0.5000 mL | Freq: Once | INTRAMUSCULAR | Status: DC

## 2018-01-07 MED ORDER — OXYTOCIN 10 UNIT/ML IJ SOLN
INTRAMUSCULAR | Status: AC
  Filled 2018-01-07: qty 4

## 2018-01-07 MED ORDER — FENTANYL CITRATE (PF) 100 MCG/2ML IJ SOLN
INTRAMUSCULAR | Status: AC
  Filled 2018-01-07: qty 2

## 2018-01-07 MED ORDER — DIPHENHYDRAMINE HCL 50 MG/ML IJ SOLN: 12.5000 mg | INTRAMUSCULAR | Status: DC | PRN

## 2018-01-07 MED ORDER — LACTATED RINGERS IV SOLN
INTRAVENOUS | Status: DC | PRN
  Administered 2018-01-07 (×2): via INTRAVENOUS

## 2018-01-07 MED ORDER — DIPHENHYDRAMINE HCL 25 MG PO CAPS
25.0000 mg | ORAL_CAPSULE | ORAL | Status: DC | PRN
  Filled 2018-01-07: qty 1

## 2018-01-07 MED ORDER — HYDROMORPHONE HCL 1 MG/ML IJ SOLN: 0.2500 mg | INTRAMUSCULAR | Status: DC | PRN

## 2018-01-07 MED ORDER — LIDOCAINE-EPINEPHRINE (PF) 2 %-1:200000 IJ SOLN
INTRAMUSCULAR | Status: DC | PRN
  Administered 2018-01-07 (×2): 5 mL via EPIDURAL

## 2018-01-07 MED ORDER — OXYTOCIN 10 UNIT/ML IJ SOLN
INTRAVENOUS | Status: DC | PRN
  Administered 2018-01-07: 40 [IU] via INTRAVENOUS

## 2018-01-07 MED ORDER — BUPIVACAINE HCL (PF) 0.25 % IJ SOLN
INTRAMUSCULAR | Status: AC
  Filled 2018-01-07: qty 10

## 2018-01-07 MED ORDER — SOD CITRATE-CITRIC ACID 500-334 MG/5ML PO SOLN: 30.0000 mL | ORAL | Status: DC

## 2018-01-07 MED ORDER — MENTHOL 3 MG MT LOZG
1.0000 | LOZENGE | OROMUCOSAL | Status: DC | PRN
  Filled 2018-01-07: qty 9

## 2018-01-07 MED ORDER — LACTATED RINGERS IV SOLN: INTRAVENOUS | Status: DC

## 2018-01-07 MED ORDER — SIMETHICONE 80 MG PO CHEW
80.0000 mg | CHEWABLE_TABLET | Freq: Three times a day (TID) | ORAL | Status: DC
  Administered 2018-01-07 – 2018-01-10 (×9): 80 mg via ORAL
  Filled 2018-01-07 (×9): qty 1

## 2018-01-07 MED ORDER — BUPIVACAINE HCL (PF) 0.5 % IJ SOLN
INTRAMUSCULAR | Status: AC
  Filled 2018-01-07: qty 30

## 2018-01-07 MED ORDER — ONDANSETRON HCL 4 MG/2ML IJ SOLN
INTRAMUSCULAR | Status: DC | PRN
  Administered 2018-01-07: 4 mg via INTRAVENOUS

## 2018-01-07 MED ORDER — MORPHINE SULFATE (PF) 0.5 MG/ML IJ SOLN
INTRAMUSCULAR | Status: DC | PRN
  Administered 2018-01-07: 4 mg via EPIDURAL

## 2018-01-07 MED ORDER — DEXAMETHASONE SODIUM PHOSPHATE 4 MG/ML IJ SOLN
INTRAMUSCULAR | Status: DC | PRN
  Administered 2018-01-07: 4 mg via INTRAVENOUS

## 2018-01-07 SURGICAL SUPPLY — 42 items
BENZOIN TINCTURE PRP APPL 2/3 (GAUZE/BANDAGES/DRESSINGS) ×3 IMPLANT
CHLORAPREP W/TINT 26ML (MISCELLANEOUS) ×3 IMPLANT
CLAMP CORD UMBIL (MISCELLANEOUS) IMPLANT
CLOSURE STERI STRIP 1/2 X4 (GAUZE/BANDAGES/DRESSINGS) ×3 IMPLANT
CLOTH BEACON ORANGE TIMEOUT ST (SAFETY) ×3 IMPLANT
DERMABOND ADVANCED (GAUZE/BANDAGES/DRESSINGS) ×2
DERMABOND ADVANCED .7 DNX12 (GAUZE/BANDAGES/DRESSINGS) ×1 IMPLANT
DRSG OPSITE POSTOP 4X10 (GAUZE/BANDAGES/DRESSINGS) ×3 IMPLANT
ELECT REM PT RETURN 9FT ADLT (ELECTROSURGICAL) ×3
ELECTRODE REM PT RTRN 9FT ADLT (ELECTROSURGICAL) ×1 IMPLANT
EXTRACTOR VACUUM BELL STYLE (SUCTIONS) ×3 IMPLANT
EXTRACTOR VACUUM KIWI (MISCELLANEOUS) ×3 IMPLANT
GAUZE SPONGE 4X4 12PLY STRL LF (GAUZE/BANDAGES/DRESSINGS) ×6 IMPLANT
GLOVE BIO SURGEON STRL SZ 6.5 (GLOVE) ×2 IMPLANT
GLOVE BIO SURGEONS STRL SZ 6.5 (GLOVE) ×1
GLOVE BIOGEL PI IND STRL 7.0 (GLOVE) ×2 IMPLANT
GLOVE BIOGEL PI INDICATOR 7.0 (GLOVE) ×4
GOWN STRL REUS W/TWL LRG LVL3 (GOWN DISPOSABLE) ×9 IMPLANT
KIT ABG SYR 3ML LUER SLIP (SYRINGE) IMPLANT
NEEDLE HYPO 22GX1.5 SAFETY (NEEDLE) IMPLANT
NEEDLE HYPO 25X5/8 SAFETYGLIDE (NEEDLE) ×3 IMPLANT
NS IRRIG 1000ML POUR BTL (IV SOLUTION) ×3 IMPLANT
PACK C SECTION WH (CUSTOM PROCEDURE TRAY) ×3 IMPLANT
PAD ABD 7.5X8 STRL (GAUZE/BANDAGES/DRESSINGS) ×3 IMPLANT
PAD OB MATERNITY 4.3X12.25 (PERSONAL CARE ITEMS) ×3 IMPLANT
PENCIL SMOKE EVAC W/HOLSTER (ELECTROSURGICAL) ×3 IMPLANT
RTRCTR C-SECT PINK 25CM LRG (MISCELLANEOUS) ×3 IMPLANT
STRIP CLOSURE SKIN 1/2X4 (GAUZE/BANDAGES/DRESSINGS) ×2 IMPLANT
SUT CHROMIC 2 0 CT 1 (SUTURE) ×6 IMPLANT
SUT MNCRL 0 VIOLET CTX 36 (SUTURE) ×2 IMPLANT
SUT MONOCRYL 0 CTX 36 (SUTURE) ×4
SUT PDS AB 0 CTX 36 PDP370T (SUTURE) IMPLANT
SUT PLAIN 2 0 (SUTURE)
SUT PLAIN 2 0 XLH (SUTURE) ×3 IMPLANT
SUT PLAIN ABS 2-0 CT1 27XMFL (SUTURE) IMPLANT
SUT VIC AB 0 CTX 36 (SUTURE) ×4
SUT VIC AB 0 CTX36XBRD ANBCTRL (SUTURE) ×2 IMPLANT
SUT VIC AB 4-0 KS 27 (SUTURE) ×3 IMPLANT
SYR CONTROL 10ML LL (SYRINGE) IMPLANT
TOWEL OR 17X24 6PK STRL BLUE (TOWEL DISPOSABLE) ×3 IMPLANT
TRAY FOLEY W/BAG SLVR 14FR LF (SET/KITS/TRAYS/PACK) ×3 IMPLANT
WATER STERILE IRR 1000ML POUR (IV SOLUTION) ×3 IMPLANT

## 2018-01-07 NOTE — Progress Notes (Signed)
Spoke to [employer], RN over the telephone to confirm that pt had been given Toradol 30mg  IV at 1816 earlier today on day shift.  The medication was not documented in [facility].  Arville Go Hydrographic surveyor) aware.  Pt had no prn PO pain med to give until provider was notified who then ordered Percocet q6hr prn.  This med Sport and exercise psychologist) was given at 2119 for a pain score of 7.  Scheduled Ibuprofen will be given at [place].

## 2018-01-07 NOTE — Lactation Note (Signed)
This note was copied from a baby's chart. Lactation Consultation Note  Patient Name: Boy Viviane Semidey [id number] Date: 01/07/2018 Reason for consult: Initial assessment;Primapara;Term Newborn is 7 hours old and latched in PACU.  Baby is currently sleepy.   Baby placed skin to skin in football hold on right side.  Baby opens and sucks 2-3 times but cant sustain a latch.  He is not showing feeding cues.  Manual pump given to pre pump.  Colostrum easily hand expressed.  Shells given with instructions.  Instructed to call for assist prn.  Discussed normal first 24 hour behavior.  Maternal Data Has patient been taught Hand Expression?: Yes Does the patient have breastfeeding experience prior to this delivery?: No  Feeding Feeding Type: Breast Fed Length of feed: (attempted/sleepy)  LATCH Score Latch: Too sleepy or reluctant, no latch achieved, no sucking elicited.  Audible Swallowing: None  Type of Nipple: Everted at rest and after stimulation  Comfort (Breast/Nipple): Soft / non-tender  Hold (Positioning): Assistance needed to correctly position infant at breast and maintain latch.  LATCH Score: 5  Interventions Interventions: Breast feeding basics reviewed;Assisted with latch;Shells;Skin to skin;Adjust position;Breast massage;Support pillows;Hand pump;Hand express  Lactation Tools Discussed/Used     Consult Status Consult Status: Follow-up Date: 01/08/18 Follow-up type: In-patient    Ave Filter 01/07/2018, 2:42 PM

## 2018-01-07 NOTE — Progress Notes (Signed)
PREOPERATIVE NOTE  Indication:  Fetal intolerance of labor / Cat II  Procedure planned: Primary cesarean section  Discussed with patient risks of cesarean delivery to include: hemorrhage, blood transfusion, infection, injury to other organs or vessels etc.  Patient voiced understanding and agrees to proceed with procedure.  All inquiries made by patient answered.  Consent signed, witnessed and placed into chart.   Preop Hb: 12.2 Allergies: NKDA Preop Abx: Ancef 2GM IV   Stephanie Delgado Stephanie Delgado

## 2018-01-07 NOTE — Anesthesia Postprocedure Evaluation (Signed)
Anesthesia Post Note  Patient: Stephanie Delgado  Procedure(s) Performed: CESAREAN SECTION (N/A )     Patient location during evaluation: Mother Baby Anesthesia Type: Epidural Level of consciousness: awake Pain management: pain level controlled Vital Signs Assessment: post-procedure vital signs reviewed and stable Respiratory status: spontaneous breathing Cardiovascular status: stable Postop Assessment: patient able to bend at knees, epidural receding, no headache and no backache Anesthetic complications: no    Last Vitals:  Vitals:   01/07/18 1103 01/07/18 1154  BP: (!) 155/106 (!) 136/96  Pulse: 94 100  Resp: 16 16  Temp: 36.6 C 36.8 C  SpO2: 98%     Last Pain:  Vitals:   01/07/18 1154  TempSrc: Axillary  PainSc: 0-No pain   Pain Goal:                 Everette Rank

## 2018-01-07 NOTE — Transfer of Care (Signed)
Immediate Anesthesia Transfer of Care Note  Patient: Stephanie Delgado  Procedure(s) Performed: CESAREAN SECTION (N/A )  Patient Location: PACU  Anesthesia Type:Epidural  Level of Consciousness: awake, alert  and patient cooperative  Airway & Oxygen Therapy: Patient Spontanous Breathing  Post-op Assessment: Report given to RN and Post -op Vital signs reviewed and stable  Post vital signs: Reviewed and stable  Last Vitals:  Vitals Value Taken Time  BP    Temp    Pulse    Resp    SpO2      Last Pain:  Vitals:   01/07/18 0516  TempSrc: Oral  PainSc:          Complications: No apparent anesthesia complications

## 2018-01-07 NOTE — Anesthesia Postprocedure Evaluation (Signed)
Anesthesia Post Note  Patient: Stephanie Delgado  Procedure(s) Performed: CESAREAN SECTION (N/A )     Patient location during evaluation: PACU Anesthesia Type: Epidural Level of consciousness: awake and alert Pain management: pain level controlled Vital Signs Assessment: post-procedure vital signs reviewed and stable Respiratory status: spontaneous breathing and respiratory function stable Cardiovascular status: blood pressure returned to baseline and stable Postop Assessment: epidural receding Anesthetic complications: no    Last Vitals:  Vitals:   01/07/18 0745 01/07/18 0746  BP: (!) 133/106   Pulse: (!) 113 (!) 111  Resp: (!) 26 18  Temp:    SpO2: 99% 99%    Last Pain:  Vitals:   01/07/18 0741  TempSrc:   PainSc: 0-No pain   Pain Goal:                 Tiajuana Amass

## 2018-01-07 NOTE — Op Note (Signed)
Cesarean Section Procedure Note  Stephanie Delgado  DOB:    [birth year]  MRN:    [id number]  Date of Surgery:  01/07/2018  Indication: 41 week SIUP admitted for IOL for post dates now being taken back to operating room for Non-reassuring fetal heart tracing, fetal intolerance of labor   Pre-operative Diagnosis: Fetal intolerance of labor  Post-operative Diagnosis: same  Procedure:  Primary cesarean delivery                         Surgeon: Caffie Damme, MD  Assistants: Noralyn Pick, CNM  Anesthesia: Epidural anesthesia  ASA Class: 2  Procedure Details   The patient was counseled about the risks, benefits, complications of the cesarean section. The patient concurred with the proposed plan, giving informed consent.  The site of surgery properly noted/marked. The patient was taken to Operating Room # 9, identified as Marlet Korte and the procedure verified as C-Section Delivery. A Time Out was held and the above information confirmed.  After epidural was found to adequate , the patient was placed in the dorsal supine position with a leftward tilt, draped and prepped in the usual sterile manner. A Pfannenstiel incision was made with a 10 blade scalpel and the incision carried down through the subcutaneous tissue to the fascia.  The fascia was incised in the midline and the fascial incision was extended laterally with Mayo scissors. The superior aspect of the fascial incision was grasped with Coker clamps x2, tented up and the rectus muscles dissected off sharply with the bovie.  The rectus was then dissected off with blunt dissection and the bovie inferiorly. The rectus muscles were separated in the midline. The abdominal peritoneum was identified, and bluntly entered using surgeons fingers. The peritoneal opening was bluntly extended with gentle pulling.  The Alexis retractor was then deployed. The vesicouterine peritoneum was identified, tented up, entered sharply with Metzenbaum  scissors, and the bladder flap was created digitally. Scalpel was then used to make a low transverse incision on the uterus which was extended laterally with  blunt dissection. The fetal vertex was identified and delivered by Kiwi vacuum from cephalic presentation.  A live healthy Female with Apgar scores of 8 at one minute and 9 at five minutes. After the umbilical cord was clamped and cut, the baby was handed off to waiting Pediatricians. Cord gases (venous) and cord blood was obtained for evaluation. The placenta was spontaneously removed intact. The placenta was handed off to be sent as a specimen for Pathology due to history of marginal insertion.   The uterus was cleared of all clot and debris. The uterine incision was repaired with #0 Monocryl in running locked fashion. A second imbricating suture was performed using the same suture. The incision was hemostatic. Ovaries and tubes were inspected and normal. The Alexis retractor was removed. The abdominal cavity was cleared of all clot and debris. Nesicaine was poured at request of anesthesia into the peritoneal cavity. The abdominal peritoneum was reapproximated with 2-0 chromic  in a running fashion, the rectus muscles was reapproximated with #2 chromic in interrupted fashion. The fascia was closed with 0 Vicryl in a running fashion. The subcuticular layer was irrigated and all bleeders cauterized.  30 mL of 0.5% Marcaine was injected into the subcutaneous layer.  The Scarpas fascia was re-approximated with interrupted sutures of 2-0 plain.   The skin was closed with 4-0 vicryl in a subcuticular fashion using a Lanny Hurst needle. The  incision was dressed with benzoine, steri strips and pressure dressing. All sponge lap and needle counts were correct x3.   Patient tolerated the procedure well and recovered in stable condition following the procedure.  Instrument, sponge, and needle counts were correct prior the abdominal closure and at the conclusion of the  case.   Findings: Live female infant, Apgars 8/9, clear amniotic fluid, placenta with 3 vessels3, normal uterus, bilateral tubes and ovaries  Estimated Blood Loss: 634mL  IVF:  2100 mL LR         Drains: Foley catheter  Urine output: 200 mL clear         Specimens: Placenta to Pathology         Implants: none         Complications:  None; patient tolerated the procedure well.         Disposition: PACU - hemodynamically stable.   Mahamadou Weltz [name]

## 2018-01-07 NOTE — Addendum Note (Signed)
Addendum  created 01/07/18 1312 by Georgeanne Nim, CRNA   Sign clinical note

## 2018-01-07 NOTE — Progress Notes (Signed)
Spoke with Russella Dar, CNM and notified of BP's in the 140's/90's. States she will get back to me with new orders or new BP parameters. Wille Celeste

## 2018-01-08 LAB — CBC
HEMATOCRIT: 22.9 % — AB (ref 36.0–46.0)
HEMOGLOBIN: 8.1 g/dL — AB (ref 12.0–15.0)
MCH: 29.9 pg (ref 26.0–34.0)
MCHC: 35.4 g/dL (ref 30.0–36.0)
MCV: 84.5 fL (ref 78.0–100.0)
Platelets: 154 10*3/uL (ref 150–400)
RBC: 2.71 MIL/uL — AB (ref 3.87–5.11)
RDW: 13.9 % (ref 11.5–15.5)
WBC: 14 10*3/uL — ABNORMAL HIGH (ref 4.0–10.5)

## 2018-01-08 MED ORDER — FERROUS SULFATE 325 (65 FE) MG PO TABS
325.0000 mg | ORAL_TABLET | Freq: Two times a day (BID) | ORAL | Status: DC
  Administered 2018-01-08 – 2018-01-10 (×5): 325 mg via ORAL
  Filled 2018-01-08 (×5): qty 1

## 2018-01-08 MED ORDER — FAMOTIDINE 20 MG PO TABS
20.0000 mg | ORAL_TABLET | Freq: Every day | ORAL | Status: DC
  Administered 2018-01-08 – 2018-01-10 (×3): 20 mg via ORAL
  Filled 2018-01-08 (×3): qty 1

## 2018-01-08 MED ORDER — POLYETHYLENE GLYCOL 3350 17 G PO PACK
17.0000 g | PACK | Freq: Every day | ORAL | Status: DC
  Administered 2018-01-08 – 2018-01-10 (×2): 17 g via ORAL
  Filled 2018-01-08 (×4): qty 1

## 2018-01-08 NOTE — Progress Notes (Addendum)
Subjective: Postpartum Day 1: Cesarean Delivery Patient reports incisional pain, tolerating PO and no problems voiding. Patient plans to walk today, is asymptomatic for anemia.     Objective: Vitals:   01/07/18 1950 01/07/18 2300 01/08/18 0030 01/08/18 0445  BP: 112/80   113/70  Pulse: (!) 123 (!) 110  100  Resp: 18  20 18   Temp: 98.3 F (36.8 C)  97.9 F (36.6 C) 98.2 F (36.8 C)  TempSrc: Oral  Oral Oral  SpO2: 98%  99% 98%  Weight:      Height:        Physical Exam:  General: alert and cooperative Lochia: appropriate Uterine Fundus: firm Incision: healing well, no significant drainage, no dehiscence, no significant erythema DVT Evaluation: No evidence of DVT seen on physical exam. Negative Homan's sign. No cords or calf tenderness. No significant calf/ankle edema.  Recent Labs    01/06/18 0101 01/08/18 0535  HGB 12.2 8.1*  HCT 35.3* 22.9*    Assessment/Plan: Status post Cesarean section. Doing well postoperatively.  Continue current care. Patient taking Procardia 30 XL QD for postpartum hypertension  Iron BID with stool softener for anemia    Marikay Alar 01/08/2018, 8:59 AM  I saw and examined patient at bedside and agree with above findings, assessment and plan as per CNM Ranee Gosselin.  Inpatient circumcision deferred as baby is under Bili lights.  Dr. Alesia Richards 01/08/2018 , 12:06 PM

## 2018-01-09 MED ORDER — FERROUS SULFATE 325 (65 FE) MG PO TABS: 325.0000 mg | ORAL_TABLET | Freq: Two times a day (BID) | ORAL | Status: DC

## 2018-01-09 NOTE — Progress Notes (Signed)
Stephanie Delgado [id number] Postpartum Postoperative Day # 2  Stephanie Delgado, G1P1001, [redacted]w[redacted]d, S/P Primary LT Cesarean Section due to induction of labor complicated by post-dates, AMA and marginal insertion of umbilical cord.  Subjective: Pt was found resting in bed with infant in bassinet on bili lights. Patient up ad lib, denies syncope or dizziness. Reports consuming regular diet without issues and denies N/V. Patient reports 0 bowel movement + passing flatus.  Denies issues with urination and reports bleeding is "small."  Patient is Breastfeeding and reports going well.  Desires undecided for postpartum contraception.  Pain is being appropriately managed with use of motrin/tylenol/percocets. Pt denies increased swelling, no HA, no epigastric pain, no vision changes.     Objective: Patient Vitals for the past 24 hrs:  BP Temp Temp src Pulse Resp SpO2  01/09/18 0915 125/79 97.8 F (36.6 C) Oral (!) 108 18 99 %  01/09/18 0528 113/76 97.8 F (36.6 C) Oral 85 16 100 %  01/08/18 2149 119/72 98 F (36.7 C) Oral (!) 102 19 -  01/08/18 1453 112/67 98.3 F (36.8 C) Oral 100 20 -     Physical Exam:  General: alert, cooperative, appears stated age and no distress Mood/Affect: Happy Lungs: clear to auscultation, no wheezes, rales or rhonchi, symmetric air entry.  Heart: normal rate, regular rhythm, normal S1, S2, no murmurs, rubs, clicks or gallops. Breast: breasts appear normal, no suspicious masses, no skin or nipple changes or axillary nodes. Abdomen:  + bowel sounds, soft, non-tender Incision: healing well, no significant drainage, no dehiscence, no significant erythema, Honeycomb dressing  Uterine Fundus: firm, involution -2 Lochia: appropriate Skin: Warm, Dry. DVT Evaluation: No evidence of DVT seen on physical exam. Negative Homan's sign. No cords or calf tenderness. No significant calf/ankle edema.  Labs: Recent Labs    01/08/18 0535  HGB 8.1*  HCT 22.9*  WBC 14.0*    CBG  (last 3)  No results for input(s): GLUCAP in the last 72 hours.   I/O: I/O last 3 completed shifts: In: -  Out: 2250 [Urine:2250]   Assessment Postpartum Postoperative Day # 3. Stephanie Delgado, G1P1001, [redacted]w[redacted]d, S/P Primary LT Cesarean Section due to induction of labor complicated by post-dates, AMA and marginal insertion of umbilical cord. Intrapartum GHTN that continued PP, but stable. Anemia.  Pt stable. -2 Involution. BreastFeeding. Hemodynamically Stable.  Plan: Continue other mgmt as ordered Anemia: iron started GHTN: pt to continue daily procardia 30mg  xl po VTE Prophylactics: SCD, ambulated as tolerates.  Pain control: Motrin/Tylenol/Narcotics PRN Education given regarding options for contraception, including barrier methods, injectable contraception, IUD placement, oral contraceptives.  Plan for discharge tomorrow, Breastfeeding, Lactation consult, Circumcision prior to discharge and Contraception undecided  Dr. Alwyn Pea to be updated on patient status  Renea Schoonmaker NP-C, CNM 01/09/2018, 1:08 PM

## 2018-01-09 NOTE — Lactation Note (Signed)
This note was copied from a baby's chart. Lactation Consultation Note  Patient Name: Stephanie Delgado [id number] Date: 01/09/2018  P1, 4 hr female infant w/ 5% weight loss. LC entered room mom in bed,  infant in basinet on billi  Lights (double photo therapy) due to jaundice and infant is  being supplemented with formula by dad with curve tip syringe. With permission LC examined mom breast which are large , full, hard with some  edema on nipple/ noticeable knots in breast. LC used reverse pressure softening, massaging breast back towards chest wall and only small amount of colostrum was expressed with hand pump. Breast shells was given to help with swelling of breast tissue.  Mom used  DEBP to help soften breast mom expressed 1.72ml of EBM and coconut oil given for breast soreness. Mom laid back after using DEBP and  ice was placed on both breast.  Mom will give EBM at next feeding, reviewed cues and feeding 8 to 12 times within 24 hours.    Maternal Data    Feeding Feeding Type: Breast Fed Length of feed: 5 min  LATCH Score                   Interventions    Lactation Tools Discussed/Used     Consult Status      Vicente Serene 01/09/2018, 6:29 AM

## 2018-01-10 MED ORDER — OXYCODONE-ACETAMINOPHEN 5-325 MG PO TABS: 1.0000 | ORAL_TABLET | Freq: Four times a day (QID) | ORAL | 0 refills | Status: DC | PRN

## 2018-01-10 MED ORDER — FERROUS SULFATE 325 (65 FE) MG PO TABS: 325.0000 mg | ORAL_TABLET | Freq: Two times a day (BID) | ORAL | 3 refills | Status: AC

## 2018-01-10 MED ORDER — TETANUS-DIPHTH-ACELL PERTUSSIS 5-2.5-18.5 LF-MCG/0.5 IM SUSP: 0.5000 mL | Freq: Once | INTRAMUSCULAR | 0 refills | Status: AC

## 2018-01-10 MED ORDER — IBUPROFEN 600 MG PO TABS: 600.0000 mg | ORAL_TABLET | Freq: Four times a day (QID) | ORAL | 0 refills | Status: DC

## 2018-01-10 MED ORDER — NIFEDIPINE ER 30 MG PO TB24: 30.0000 mg | ORAL_TABLET | Freq: Every day | ORAL | 1 refills | Status: AC

## 2018-01-10 NOTE — Lactation Note (Signed)
This note was copied from a baby's chart. Lactation Consultation Note Baby 61 hrs old. Mom called out for latching assistance. Baby sleepy at breast. Will latch then not suckle but a few times.  Baby had a perfect flange to breast. Mom c/o severe painful latching. LC adjusted, mom stated it still hurts bad. LC unlatched. Mom's nipples had raspberry appearance w/blisters to end of nipple.  Assessed baby's mouth. Has thick labial frenulum, tongue doesn't extend past gum line. Assessed suck w/gloved finger. Noted high palate and tongue not cupping around finger, felt gums chomping at intervals.  Fitted mom w/#24 NS. Mom stated felt much better. Baby sleepy at the breast. Encouraged to un-swaddle when feeding. Inserted w/curve tip syring colostrum into NS. Baby suckled then goes back to sleep. Baby not on DPT any longer, looks jaundice. Serum will be drawn at 0500. Encouraged to pump after feeding. Breast getting engorged. Discussed importance of pumping to relieve engorgement.   Patient Name: Stephanie Delgado [id number] Date: 01/10/2018 Reason for consult: Follow-up assessment;Difficult latch   Maternal Data    Feeding Feeding Type: Breast Fed Nipple Type: Slow - flow  LATCH Score Latch: Repeated attempts needed to sustain latch, nipple held in mouth throughout feeding, stimulation needed to elicit sucking reflex.  Audible Swallowing: Spontaneous and intermittent  Type of Nipple: Everted at rest and after stimulation  Comfort (Breast/Nipple): Engorged, cracked, bleeding, large blisters, severe discomfort  Hold (Positioning): Assistance needed to correctly position infant at breast and maintain latch.  LATCH Score: 6  Interventions Interventions: Breast feeding basics reviewed;Support pillows;Assisted with latch;Position options;Skin to skin;Breast compression;Ice;DEBP  Lactation Tools Discussed/Used Tools: Nipple Shields Nipple shield size: 24 Flange Size: 27 Shell Type:  Inverted Breast pump type: Double-Electric Breast Pump   Consult Status Consult Status: Follow-up Date: 01/10/18 Follow-up type: In-patient    Theodoro Kalata 01/10/2018, 3:52 AM

## 2018-01-10 NOTE — Discharge Instructions (Signed)
Cesarean Delivery Cesarean birth, or cesarean delivery, is the surgical delivery of a baby through an incision in the abdomen and the uterus. This may be referred to as a C-section. This procedure may be scheduled ahead of time, or it may be done in an emergency situation. Tell a health care provider about:  Any allergies you have.  All medicines you are taking, including vitamins, herbs, eye drops, creams, and over-the-counter medicines.  Any problems you or family members have had with anesthetic medicines.  Any blood disorders you have.  Any surgeries you have had.  Any medical conditions you have.  Whether you or any members of your family have a history of deep vein thrombosis (DVT) or pulmonary embolism (PE). What are the risks? Generally, this is a safe procedure. However, problems may occur, including:  Infection.  Bleeding.  Allergic reactions to medicines.  Damage to other structures or organs.  Blood clots.  Injury to your baby.  What happens before the procedure?  Follow instructions from your health care provider about eating or drinking restrictions.  Follow instructions from your health care provider about bathing before your procedure to help reduce your risk of infection.  If you know that you are going to have a cesarean delivery, do not shave your pubic area. Shaving before the procedure may increase your risk of infection.  Ask your health care provider about: ? Changing or stopping your regular medicines. This is especially important if you are taking diabetes medicines or blood thinners. ? Your pain management plan. This is especially important if you plan to breastfeed your baby. ? How long you will be in the hospital after the procedure. ? Any concerns you may have about receiving blood products if you need them during the procedure. ? Cord blood banking, if you plan to collect your babys umbilical cord blood.  You may also want to ask your  health care provider: ? Whether you will be able to hold or breastfeed your baby while you are still in the operating room. ? Whether your baby can stay with you immediately after the procedure and during your recovery. ? Whether a family member or a person of your choice can go with you into the operating room and stay with you during the procedure, immediately after the procedure, and during your recovery.  Plan to have someone drive you home when you are discharged from the hospital. What happens during the procedure?  Fetal monitors will be placed on your abdomen to monitor your heart rate and your baby's heart rate.  Depending on the reason for your cesarean delivery, you may have a physical exam or additional testing, such as an ultrasound.  An IV tube will be inserted into one of your veins.  You may have your blood or urine tested.  You will be given antibiotic medicine to help prevent infection.  You may be given a special warming gown to wear to keep your temperature stable.  Hair may be removed from your pubic area.  The skin of your pubic area and lower abdomen will be cleaned with a germ-killing solution (antiseptic).  A catheter may be inserted into your bladder through your urethra. This drains your urine during the procedure.  You may be given one or more of the following: ? A medicine to numb the area (local anesthetic). ? A medicine to make you fall asleep (general anesthetic). ? A medicine (regional anesthetic) that is injected into your back or through a small  thin tube placed in your back (spinal anesthetic or epidural anesthetic). This numbs everything below the injection site and allows you to stay awake during your procedure. If this makes you feel nauseous, tell your health care provider. Medicines will be available to help reduce any nausea you may feel.  An incision will be made in your abdomen, and then in your uterus.  If you are awake during your  procedure, you may feel tugging and pulling in your abdomen, but you should not feel pain. If you feel pain, tell your health care provider immediately.  Your baby will be removed from your uterus. You may feel more pressure or pushing while this happens.  Immediately after birth, your baby will be dried and kept warm. You may be able to hold and breastfeed your baby. The umbilical cord may be clamped and cut during this time.  Your placenta will be removed from your uterus.  Your incisions will be closed with stitches (sutures). Staples, skin glue, or adhesive strips may also be applied to the incision in your abdomen.  Bandages (dressings) will be placed over the incision in your abdomen. The procedure may vary among health care providers and hospitals. What happens after the procedure?  Your blood pressure, heart rate, breathing rate, and blood oxygen level will be monitored often until the medicines you were given have worn off.  You may continue to receive fluids and medicines through an IV tube.  You will have some pain. Medicines will be available to help control your pain.  To help prevent blood clots: ? You may be given medicines. ? You may have to wear compression stockings or devices. ? You will be encouraged to walk around when you are able.  Hospital staff will encourage and support bonding with your baby. Your hospital may allow you and your baby to stay in the same room (rooming in) during your hospital stay to encourage successful breastfeeding.  You may be encouraged to cough and breathe deeply often. This helps to prevent lung problems.  If you have a catheter draining your urine, it will be removed as soon as possible after your procedure. This information is not intended to replace advice given to you by your health care provider. Make sure you discuss any questions you have with your health care provider. Document Released: 06/17/2005 Document Revised: 11/23/2015  Document Reviewed: 03/28/2015 Elsevier Interactive Patient Education  2018 [employer].   Postpartum Care After Cesarean Delivery The period of time right after you deliver your newborn is called the postpartum period. What kind of medical care will I receive?  You may continue to receive fluids and medicines through an IV tube inserted into one of your veins.  You may have small, flexible tube (catheter) draining urine from your bladder into a bag outside of your body. The catheter will be removed as soon as possible.  You may be given a squirt bottle to use when you go to the bathroom. You may use this until you are comfortable wiping as usual. To use the squirt bottle, follow these steps: ? Before you urinate, fill the squirt bottle with warm water. The water should be warm. Do not use hot water. ? After you urinate, while you are sitting on the toilet, use the squirt bottle to rinse the area around your urethra and vaginal opening. This rinses away any urine and blood. ? You may do this instead of wiping. As you start healing, you may use the squirt  bottle before wiping yourself. Make sure to wipe gently. ? Fill the squirt bottle with clean water every time you use the bathroom.  You will be given sanitary pads to wear.  Your incision will be monitored to make sure it is healing properly. You will be told when it is safe for your stitches, staples, or skin adhesive tape to be removed. What can I expect?  You may not feel the need to urinate for several hours after delivery.  You will have some soreness and pain in your abdomen. You may have a small amount of blood or clear fluid coming from your incision.  If you are breastfeeding, you may have uterine contractions every time you breastfeed for up to several weeks postpartum. Uterine contractions help your uterus return to its normal size.  It is normal to have vaginal bleeding (lochia) after delivery. The amount and appearance of  lochia is often similar to a menstrual period in the first week after delivery. It will gradually decrease over the next few weeks to a dry, yellow-brown discharge. For most women, lochia stops completely by 6-8 weeks after delivery. Vaginal bleeding can vary from woman to woman.  Within the first few days after delivery, you may have breast engorgement. This is when your breasts feel heavy, full, and uncomfortable. Your breasts may also throb and feel hard, tightly stretched, warm, and tender. After this occurs, you may have milk leaking from your breasts.Your health care provider can help you relieve discomfort due to breast engorgement. Breast engorgement should go away within a few days.  You may feel more sad or worried than normal due to hormonal changes after delivery. These feelings should not last more than a few days. If these feelings do not go away after several days, speak with your health care provider. How should I care for myself?  Tell your health care provider if you have pain or discomfort.  Drink enough water to keep your urine clear or pale yellow.  Wash your hands thoroughly with soap and water for at least 20 seconds after changing your sanitary pads or using the toilet, and before holding or feeding your baby.  If you are not breastfeeding, avoid touching your breasts a lot. Doing this can make your breasts produce more milk.  If you become weak or lightheaded, or you feel like you might faint, ask for help before: ? Getting out of bed. ? Showering.  Change your sanitary pads frequently. Watch for any changes in your flow, such as a sudden increase in volume, a change in color, or the passing of large blood clots. If you pass a blood clot from your vagina, save it to show to your health care provider. Do not flush blood clots down the toilet without having your health care provider look at them.  Make sure that all your vaccinations are up to date. This can help protect  you and your baby from getting certain diseases. You may need to have immunizations done before you leave the hospital.  If desired, talk with your health care provider about methods of family planning or birth control (contraception). How can I start bonding with my baby? Spending as much time as possible with your baby is very important. During this time, you and your baby can get to know each other and develop a bond. Having your baby stay with you in your room (rooming in) can give you time to get to know your baby. Rooming in can also  help you become comfortable caring for your baby. Breastfeeding can also help you bond with your baby. How can I plan for returning home with my baby?  Make sure that you have a car seat installed in your vehicle. ? Your car seat should be checked by a certified car seat installer to make sure that it is installed safely. ? Make sure that your baby fits into the car seat safely.  Ask your health care provider any questions you have about caring for yourself or your baby. Make sure that you are able to contact your health care provider with any questions after leaving the hospital. This information is not intended to replace advice given to you by your health care provider. Make sure you discuss any questions you have with your health care provider. Document Released: 03/11/2012 Document Revised: 11/20/2015 Document Reviewed: 05/22/2015 Elsevier Interactive Patient Education  [employer].

## 2018-01-10 NOTE — Discharge Summary (Signed)
OB Discharge Summary     Patient Name: Stephanie Delgado DOB: 08-29-1980 MRN: [id number]  Date of admission: 01/06/2018 Delivering MD: Sanjuana Kava   Date of discharge: 01/10/2018  Admitting diagnosis: INDUCTION Intrauterine pregnancy: [redacted]w[redacted]d     Secondary diagnosis:  Active Problems:   Pregnancy, post-term  Additional problems: Gestational Hypertension     Discharge diagnosis: Term Pregnancy Delivered and Gestational Hypertension                                                                                                Post partum procedures:  Augmentation: Pitocin and Cytotec  Complications: None  Hospital course:  Induction of Labor With Cesarean Section  37 y.o. yo G1P1001 at [redacted]w[redacted]d was admitted to the hospital 01/06/2018 for induction of labor. Patient had a labor course significant for NRFHT's . The patient went for cesarean section due to Non-Reassuring FHR, and delivered a Viable infant,01/07/2018  Membrane Rupture Time/Date: 2:00 PM ,01/06/2018   Details of operation can be found in separate operative Note.  Patient had an uncomplicated postpartum course. She is ambulating, tolerating a regular diet, passing flatus, and urinating well.  Patient is discharged home in stable condition on 01/10/18.                                    Physical exam  Vitals:   01/09/18 0915 01/09/18 1342 01/09/18 2124 01/10/18 0547  BP: 125/79 135/78 131/83 131/77  Pulse: (!) 108 (!) 111 (!) 111 (!) 107  Resp: 18 18 18 16   Temp: 97.8 F (36.6 C) 98.2 F (36.8 C) (!) 97.5 F (36.4 C) 98.1 F (36.7 C)  TempSrc: Oral Oral Oral Oral  SpO2: 99%   100%  Weight:      Height:       General: alert, cooperative and no distress Lochia: appropriate Uterine Fundus: firm Incision: Dressing is clean, dry, and intact DVT Evaluation: No evidence of DVT seen on physical exam. Labs: Lab Results  Component Value Date   WBC 14.0 (H) 01/08/2018   HGB 8.1 (L) 01/08/2018   HCT 22.9 (L) 01/08/2018   MCV  84.5 01/08/2018   PLT 154 01/08/2018   CMP Latest Ref Rng & Units 01/06/2018  Glucose 70 - 99 mg/dL 80  BUN 6 - 20 mg/dL 6  Creatinine 0.44 - 1.00 mg/dL 0.59  Sodium 135 - 145 mmol/L 134(L)  Potassium 3.5 - 5.1 mmol/L 3.8  Chloride 98 - 111 mmol/L 103  CO2 22 - 32 mmol/L 22  Calcium 8.9 - 10.3 mg/dL 8.6(L)  Total Protein 6.5 - 8.1 g/dL 6.0(L)  Total Bilirubin 0.3 - 1.2 mg/dL 0.5  Alkaline Phos 38 - 126 U/L 157(H)  AST 15 - 41 U/L 17  ALT 0 - 44 U/L 12    Discharge instruction: per After Visit Summary and "Baby and Me Booklet".  After visit meds:  Allergies as of 01/10/2018   No Known Allergies     Medication List    TAKE these medications  ferrous sulfate 325 (65 FE) MG tablet Take 1 tablet (325 mg total) by mouth 2 (two) times daily with a meal.   ibuprofen 600 MG tablet Commonly known as:  ADVIL,MOTRIN Take 1 tablet (600 mg total) by mouth every 6 (six) hours.   NIFEdipine 30 MG 24 hr tablet Commonly known as:  PROCARDIA-XL/ADALAT CC Take 1 tablet (30 mg total) by mouth daily. Start taking on:  01/11/2018   oxyCODONE-acetaminophen 5-325 MG tablet Commonly known as:  PERCOCET/ROXICET Take 1-2 tablets by mouth every 6 (six) hours as needed for moderate pain.   prenatal multivitamin Tabs tablet Take 1 tablet by mouth daily at 12 noon.   Tdap 5-2.5-18.5 LF-MCG/0.5 injection Commonly known as:  BOOSTRIX Inject 0.5 mLs into the muscle once for 1 dose.       Diet: routine diet  Activity: Advance as tolerated. Pelvic rest for 6 weeks.   Outpatient follow up:1 week per Dr. Alwyn Pea Follow up Appt:No future appointments. Follow up Visit:No follow-ups on file.  Postpartum contraception: Undecided  Newborn Data: Live born female  Birth Weight: 6 lb 8.1 oz (2951 g) APGAR: 8, 9  Newborn Delivery   Birth date/time:  01/07/2018 06:43:00 Delivery type:  C-Section, Vacuum Assisted Trial of labor:  Yes C-section categorization:  Primary     Baby Feeding:  Breast Disposition:home with mother   01/10/2018 Larey Days, CNM

## 2018-01-10 NOTE — Lactation Note (Signed)
This note was copied from a baby's chart. Lactation Consultation Note Baby 15 hrs old. Baby on DPT. Mom stated she has only pumped 2 times by herself today. Been giving baby formula today because her breast was so hard. Mom wearing shells. Nipples everted well, edema in areola decreased as well as in legs and feet. Mom stated she didn't pump again because the first time she pumped she collected colostrum, the send time she pumped she didn't get anything. Reviewed again engorgement, prevention, management, milk storage, icing, breast massage, and latching. Stressed importance of putting baby to breast instead of giving formula since milk coming in. Discussed supplementing after BF and giving BM. Before leaving, laid mom flat w/ICE to breast after finished pumping for 20 min. Mom still had knots to breast. LC massaged breast while pumping, coconut oil applied.  Encouraged to pump every three hours while breast filling.   Patient Name: Boy Audrielle Vankuren [id number] Date: 01/10/2018 Reason for consult: Follow-up assessment;Hyperbilirubinemia   Maternal Data    Feeding Feeding Type: Formula Nipple Type: Slow - flow  LATCH Score       Type of Nipple: Everted at rest and after stimulation  Comfort (Breast/Nipple): Engorged, cracked, bleeding, large blisters, severe discomfort        Interventions Interventions: Expressed milk;Breast massage;Coconut oil;Shells;Reverse pressure;Breast compression;DEBP;Ice  Lactation Tools Discussed/Used Tools: Shells;Pump;Coconut oil Flange Size: 27 Shell Type: Inverted Breast pump type: Double-Electric Breast Pump   Consult Status Consult Status: Follow-up Date: 01/10/18 Follow-up type: In-patient    Jeniffer Culliver, Elta [place] 01/10/2018, 2:15 AM

## 2018-01-10 NOTE — Lactation Note (Signed)
This note was copied from a baby's chart. Lactation Consultation Note  Patient Name: Stephanie Delgado [id number] Date: 01/10/2018   Mom is experiencing lactogenesis II & her upper outer quadrants are engorged. Mom is content with not putting infant to breast & is happy with pumping & BO (although there has been a delay in Mom regularly pumping).   During the course of the morning, Mom was assisted with pumping three different times (the 1st time she did not know to increase suction, so Mom asked to do a 2nd pumping session soon thereafter with an eye on increasing suction). Mom had been using size 30 flanges, but the size 27 flanges are appropriate for her. Mom had not been getting anything with pumping (0 - 2 mLs total). However, hand expression was taught to Mom & she was able to do it herself successfully. With hand expression, we were able to obtain 85mLs with relative ease. Mom then wanted to pump again; she was able to obtain 19 mLs with her last pumping session before discharge home. EBM fed to infant via bottle.   Relief of/prevention of further engorgement guidelines provided.  Matthias Hughs [facility] 01/10/2018, 10:07 AM

## 2018-01-13 ENCOUNTER — Inpatient Hospital Stay (HOSPITAL_COMMUNITY)
Admission: AD | Admit: 2018-01-13 | Discharge: 2018-01-13 | Disposition: A | Discharge status: Home / Self Care | Payer: 59 | Source: Ambulatory Visit | Attending: Obstetrics and Gynecology | Admitting: Obstetrics and Gynecology

## 2018-01-13 ENCOUNTER — Encounter (HOSPITAL_COMMUNITY): Payer: Self-pay | Admitting: *Deleted

## 2018-01-13 DIAGNOSIS — R109 Unspecified abdominal pain: Secondary | ICD-10-CM | POA: Insufficient documentation

## 2018-01-13 DIAGNOSIS — R238 Other skin changes: Secondary | ICD-10-CM

## 2018-01-13 DIAGNOSIS — R1031 Right lower quadrant pain: Secondary | ICD-10-CM | POA: Diagnosis not present

## 2018-01-13 DIAGNOSIS — Z8249 Family history of ischemic heart disease and other diseases of the circulatory system: Secondary | ICD-10-CM | POA: Diagnosis not present

## 2018-01-13 DIAGNOSIS — Z9889 Other specified postprocedural states: Secondary | ICD-10-CM | POA: Diagnosis not present

## 2018-01-13 DIAGNOSIS — F424 Excoriation (skin-picking) disorder: Secondary | ICD-10-CM | POA: Diagnosis not present

## 2018-01-13 DIAGNOSIS — L309 Dermatitis, unspecified: Secondary | ICD-10-CM | POA: Diagnosis not present

## 2018-01-13 LAB — CBC
HCT: 24.4 % — ABNORMAL LOW (ref 36.0–46.0)
Hemoglobin: 8.3 g/dL — ABNORMAL LOW (ref 12.0–15.0)
MCH: 29.3 pg (ref 26.0–34.0)
MCHC: 34 g/dL (ref 30.0–36.0)
MCV: 86.2 fL (ref 78.0–100.0)
Platelets: 290 10*3/uL (ref 150–400)
RBC: 2.83 MIL/uL — ABNORMAL LOW (ref 3.87–5.11)
RDW: 13.6 % (ref 11.5–15.5)
WBC: 10.1 10*3/uL (ref 4.0–10.5)

## 2018-01-13 NOTE — MAU Note (Signed)
Urine in lab 

## 2018-01-13 NOTE — MAU Provider Note (Signed)
History     CSN: [id number]  Arrival date & time 01/13/18  1005   None     Chief Complaint  Patient presents with  . Wound Check    HPI Stephanie Delgado 37 y.o. G1P1001 postop C/S x 6 days. Comes to MAU with c/o sharp pain on her right side that feels like it is on her skin. She had allergic reaction to honeycomb and has excoriation in several places that are now healing. She has been using neosporin. History reviewed. No pertinent past medical history.  Past Surgical History:  Procedure Laterality Date  . CESAREAN SECTION N/A 01/07/2018   Procedure: CESAREAN SECTION;  Surgeon: Sanjuana Kava, MD;  Location: [place];  Service: Obstetrics;  Laterality: N/A;  . CYST REMOVAL TRUNK     back  . TONSILLECTOMY AND ADENOIDECTOMY    . WISDOM TOOTH EXTRACTION      Family History  Problem Relation Age of Onset  . Diabetes Maternal Grandmother   . Hypertension Maternal Grandmother   . Thrombosis Maternal Grandmother     Social History   Tobacco Use  . Smoking status: Never Smoker  . Smokeless tobacco: Never Used  Substance Use Topics  . Alcohol use: No  . Drug use: No    OB History    Gravida  1   Para  1   Term  1   Preterm      AB      Living  1     SAB      TAB      Ectopic      Multiple  0   Live Births  1           Review of Systems Review of Systems - Negative except See HPI Allergies  Patient has no known allergies.  Home Medications    BP (!) 141/92 (BP Location: Right Arm)   Pulse (!) 129   Temp 98.8 F (37.1 C)   Resp 18   Ht 5' 9.5" (1.765 m)   Wt 243 lb (110.2 kg)   SpO2 100%   BMI 35.37 kg/m   Physical Exam Physical Examination: General appearance - alert, well appearing, and in no distress, oriented to person, place, and time and anxious Physical Examination: Mental status - alert, oriented to person, place, and time Physical Examination: Chest - clear to auscultation, no wheezes, rales or rhonchi, symmetric air  entry, no tachypnea, retractions or cyanosis Heart - normal rate, regular rhythm, normal S1, S2, no murmurs, rubs, clicks or gallops, normal rate and regular rhythm Abdomen - soft, tender on the right side where she has some excoriation, nondistended, no masses or organomegaly C/S incision is reddened where the skin was covered by honeycomb dressing, no bleeding, intact,no pus, firm to touch but not painful Musculoskeletal - no joint tenderness, deformity or swelling, full range of motion without pain Extremities - feet normal, good pulses, normal color, temperature and sensation, Homan's sign negative bilaterally Skin - normal coloration and turgor, no rashes, no suspicious skin lesions noted LESIONS NOTED: scabbed, excoriated on the abdomen where honeycomb dressing was MAU Course  Procedures (including critical care time)  Labs Reviewed  CBC - Abnormal; Notable for the following components:      Result Value   RBC 2.83 (*)    Hemoglobin 8.3 (*)    HCT 24.4 (*)    All other components within normal limits   No results found.   1. Skin irritation  MDM  After evaluation pt was discharged to home with normal WBC. Told pt to look for s/sx of infection. Continue neosporin and continue ibuprofen and percocet when needed. Keep next scheduled appointment

## 2018-01-13 NOTE — MAU Note (Signed)
Pt reports she has a lot of irritation from her dressing on her c-section and she is having a sharp stabbing pain to the right of her incision.

## 2018-01-14 ENCOUNTER — Other Ambulatory Visit: Payer: Self-pay

## 2018-01-14 ENCOUNTER — Inpatient Hospital Stay (HOSPITAL_COMMUNITY)
Admission: AD | Admit: 2018-01-14 | Discharge: 2018-01-14 | Disposition: A | Discharge status: Home / Self Care | Payer: 59 | Source: Ambulatory Visit | Attending: Obstetrics and Gynecology | Admitting: Obstetrics and Gynecology

## 2018-01-14 ENCOUNTER — Inpatient Hospital Stay (HOSPITAL_COMMUNITY): Payer: 59

## 2018-01-14 ENCOUNTER — Encounter (HOSPITAL_COMMUNITY): Payer: Self-pay

## 2018-01-14 DIAGNOSIS — T148XXA Other injury of unspecified body region, initial encounter: Secondary | ICD-10-CM

## 2018-01-14 DIAGNOSIS — O902 Hematoma of obstetric wound: Secondary | ICD-10-CM | POA: Diagnosis not present

## 2018-01-14 DIAGNOSIS — F424 Excoriation (skin-picking) disorder: Secondary | ICD-10-CM | POA: Diagnosis not present

## 2018-01-14 DIAGNOSIS — S301XXA Contusion of abdominal wall, initial encounter: Secondary | ICD-10-CM | POA: Diagnosis not present

## 2018-01-14 DIAGNOSIS — N939 Abnormal uterine and vaginal bleeding, unspecified: Secondary | ICD-10-CM | POA: Diagnosis not present

## 2018-01-14 HISTORY — DX: Other specified health status: Z78.9

## 2018-01-14 LAB — CBC
HCT: 24.3 % — ABNORMAL LOW (ref 36.0–46.0)
Hemoglobin: 8.2 g/dL — ABNORMAL LOW (ref 12.0–15.0)
MCH: 28.9 pg (ref 26.0–34.0)
MCHC: 33.7 g/dL (ref 30.0–36.0)
MCV: 85.6 fL (ref 78.0–100.0)
Platelets: 321 10*3/uL (ref 150–400)
RBC: 2.84 MIL/uL — ABNORMAL LOW (ref 3.87–5.11)
RDW: 13.5 % (ref 11.5–15.5)
WBC: 9.8 10*3/uL (ref 4.0–10.5)

## 2018-01-14 LAB — POCT FERN TEST

## 2018-01-14 MED ORDER — AMOXICILLIN-POT CLAVULANATE 875-125 MG PO TABS: 1.0000 | ORAL_TABLET | Freq: Two times a day (BID) | ORAL | 0 refills | Status: AC

## 2018-01-14 MED ORDER — OXYCODONE HCL 5 MG PO TABS: 5.0000 mg | ORAL_TABLET | Freq: Three times a day (TID) | ORAL | 0 refills | Status: AC | PRN

## 2018-01-14 NOTE — MAU Note (Addendum)
C/S one week ago. Pt C/O bleeding from incision, has bled through her underwear, started yesterday.  Had blood running down her leg from incision last night. "It's like bottles of blood."  Denies purulent drainage, states steri strips are intact.

## 2018-01-14 NOTE — MAU Provider Note (Signed)
History     CSN: [id number]  Arrival date & time 01/14/18  8638   None     Chief Complaint  Patient presents with  . incision bleeding    Stephanie Delgado 37 y.o. G1P1001 @ 1 week post op from primary C/S. Returns to MAU stating that she is bleeding from incision. She was in MAU yesterday for pain on her right side from skin excoriation from honeycomb dressing. Nursing Note - C/S one week ago. Pt C/O bleeding from incision, has bled through her underwear, started yesterday.  Had blood running down her leg from incision last night. "It's like bottles of blood."  Denies purulent drainage, states steri strips are intact.   Past Medical History:  Diagnosis Date  . Medical history non-contributory     Past Surgical History:  Procedure Laterality Date  . CESAREAN SECTION N/A 01/07/2018   Procedure: CESAREAN SECTION;  Surgeon: Sanjuana Kava, MD;  Location: [place];  Service: Obstetrics;  Laterality: N/A;  . CYST REMOVAL TRUNK     back  . TONSILLECTOMY AND ADENOIDECTOMY    . WISDOM TOOTH EXTRACTION      Family History  Problem Relation Age of Onset  . Diabetes Maternal Grandmother   . Hypertension Maternal Grandmother   . Thrombosis Maternal Grandmother     Social History   Tobacco Use  . Smoking status: Never Smoker  . Smokeless tobacco: Never Used  Substance Use Topics  . Alcohol use: No  . Drug use: No    OB History    Gravida  1   Para  1   Term  1   Preterm      AB      Living  1     SAB      TAB      Ectopic      Multiple  0   Live Births  1           Review of Systems  Gastrointestinal: Positive for abdominal pain.       Bleeding from incision  All other systems reviewed and are negative.   Allergies  Patient has no known allergies.  Home Medications    BP 137/87   Pulse (!) 115   Temp 98.6 F (37 C) (Oral)   Resp 18   SpO2 98%   Physical Exam  Constitutional: She is oriented to person, place, and time. She  appears well-developed and well-nourished.  HENT:  Head: Normocephalic.  Neck: Normal range of motion.  Cardiovascular:  Mild tachycardia  Pulmonary/Chest: Effort normal. No respiratory distress.  Abdominal: Soft. Bowel sounds are normal.  No active bleeding from incision at this time but it has been bleeding sporadically since yesterday afternoon.  Musculoskeletal: Normal range of motion.  Neurological: She is alert and oriented to person, place, and time.  Skin: Skin is warm and dry.  Multiple excoriations that are healing from area where honeycomb dressing was.  Psychiatric: She has a normal mood and affect. Her behavior is normal.  Nursing note and vitals reviewed.   MAU Course  Procedures (including critical care time)  Labs Reviewed  CBC - Abnormal; Notable for the following components:      Result Value   RBC 2.84 (*)    Hemoglobin 8.2 (*)    HCT 24.3 (*)    All other components within normal limits  POCT FERN TEST   US Pelvis Limited (transabdominal Only)  Result Date: 01/14/2018 CLINICAL DATA:  37 year old  female status post cesarean section 01/07/2018, presenting with bleeding from incision site. Request to evaluate for pelvic wall hematoma. EXAM: TRANSABDOMINAL ULTRASOUND OF PELVIS TECHNIQUE: Transabdominal ultrasound examination of the pelvis was performed including evaluation of the uterus, ovaries, adnexal regions, and pelvic cul-de-sac. COMPARISON:  None. FINDINGS: There is a 10.9 x 5.7 x 13.9 cm hematoma in the deep subcutaneous ventral pelvic wall centered at the midline, which extends deep through diastatic rectus abdominis muscles into the ventral extraperitoneal space anterior to the bladder, with this deeper component of the hematoma measuring 10.7 x 4.0 x 5.7 cm. Please note that uterus and adnexal regions are not evaluated on this limited transabdominal pelvic survey. IMPRESSION: 10.9 x 5.7 x 13.9 cm hematoma in the deep subcutaneous ventral pelvic wall centered  at the midline. Deep extension of this hematoma through diastatic rectus abdominus muscles into the ventral extraperitoneal pelvic space anterior to the bladder, with deeper hematoma component measuring 10.7 x 4.0 x 5.7 cm. These results were called by telephone at the time of interpretation on 01/14/2018 at 11:41 am to [facility] , who verbally acknowledged these results. Electronically Signed   By: Ilona Sorrel M.D.   On: 01/14/2018 11:41     1. Hematoma       MDM  After reviewing labs and U/S with Dr Cletis Media. The plan will be for the patient to be vigilent regarding s/sx of infection. She will be started on Augmentin. Ibuprofen and Percocet will be discontinued and  replaced with tylenol and hydrocodone. She is too apply warm moist compress to incision twice a day. She has appointment to be seen by Dr. Alesia Richards on Monday in the office. She had tried to schedule with Dr Alwyn Pea but that MD is out of town. She is to continue taking her iron and Procardia.

## 2018-01-14 NOTE — Progress Notes (Signed)
Accidentally ordered Fern test on this pt.  Pt postpartum C/S

## 2018-01-19 DIAGNOSIS — Z4801 Encounter for change or removal of surgical wound dressing: Secondary | ICD-10-CM | POA: Diagnosis not present

## 2018-01-19 DIAGNOSIS — O902 Hematoma of obstetric wound: Secondary | ICD-10-CM | POA: Diagnosis not present

## 2018-01-29 ENCOUNTER — Other Ambulatory Visit: Payer: Self-pay

## 2018-01-29 ENCOUNTER — Encounter (HOSPITAL_COMMUNITY)
Admission: AD | Disposition: A | Discharge status: Home / Self Care | Payer: Self-pay | Source: Ambulatory Visit | Attending: Obstetrics & Gynecology

## 2018-01-29 ENCOUNTER — Encounter (HOSPITAL_COMMUNITY): Payer: Self-pay | Admitting: *Deleted

## 2018-01-29 ENCOUNTER — Inpatient Hospital Stay (HOSPITAL_COMMUNITY): Payer: 59 | Admitting: Anesthesiology

## 2018-01-29 ENCOUNTER — Observation Stay (HOSPITAL_COMMUNITY)
Admission: AD | Admit: 2018-01-29 | Discharge: 2018-01-30 | Disposition: A | Discharge status: Home / Self Care | Payer: 59 | Source: Ambulatory Visit | Attending: Obstetrics & Gynecology | Admitting: Obstetrics & Gynecology

## 2018-01-29 DIAGNOSIS — I96 Gangrene, not elsewhere classified: Secondary | ICD-10-CM | POA: Insufficient documentation

## 2018-01-29 DIAGNOSIS — E669 Obesity, unspecified: Secondary | ICD-10-CM | POA: Insufficient documentation

## 2018-01-29 DIAGNOSIS — L7682 Other postprocedural complications of skin and subcutaneous tissue: Secondary | ICD-10-CM | POA: Insufficient documentation

## 2018-01-29 DIAGNOSIS — T8131XA Disruption of external operation (surgical) wound, not elsewhere classified, initial encounter: Secondary | ICD-10-CM | POA: Diagnosis not present

## 2018-01-29 DIAGNOSIS — O900 Disruption of cesarean delivery wound: Principal | ICD-10-CM | POA: Insufficient documentation

## 2018-01-29 DIAGNOSIS — T8131XS Disruption of external operation (surgical) wound, not elsewhere classified, sequela: Secondary | ICD-10-CM | POA: Diagnosis not present

## 2018-01-29 DIAGNOSIS — Z6832 Body mass index (BMI) 32.0-32.9, adult: Secondary | ICD-10-CM | POA: Diagnosis not present

## 2018-01-29 DIAGNOSIS — M629 Disorder of muscle, unspecified: Secondary | ICD-10-CM | POA: Diagnosis present

## 2018-01-29 HISTORY — DX: Female infertility, unspecified: N97.9

## 2018-01-29 HISTORY — PX: SCAR REVISION: SHX5285

## 2018-01-29 LAB — CBC
HCT: 34.7 % — ABNORMAL LOW (ref 36.0–46.0)
Hemoglobin: 11.5 g/dL — ABNORMAL LOW (ref 12.0–15.0)
MCH: 28.6 pg (ref 26.0–34.0)
MCHC: 33.1 g/dL (ref 30.0–36.0)
MCV: 86.3 fL (ref 78.0–100.0)
PLATELETS: 312 10*3/uL (ref 150–400)
RBC: 4.02 MIL/uL (ref 3.87–5.11)
RDW: 14.9 % (ref 11.5–15.5)
WBC: 6.3 10*3/uL (ref 4.0–10.5)

## 2018-01-29 LAB — TYPE AND SCREEN
ABO/RH(D): O POS
ANTIBODY SCREEN: NEGATIVE

## 2018-01-29 SURGERY — REVISION, SCAR
Anesthesia: General

## 2018-01-29 MED ORDER — HYDROMORPHONE HCL 1 MG/ML IJ SOLN
0.5000 mg | INTRAMUSCULAR | Status: AC | PRN
  Administered 2018-01-29: 0.5 mg via INTRAVENOUS

## 2018-01-29 MED ORDER — FENTANYL CITRATE (PF) 100 MCG/2ML IJ SOLN
INTRAMUSCULAR | Status: AC
  Filled 2018-01-29: qty 2

## 2018-01-29 MED ORDER — SIMETHICONE 80 MG PO CHEW: 80.0000 mg | CHEWABLE_TABLET | Freq: Four times a day (QID) | ORAL | Status: DC | PRN

## 2018-01-29 MED ORDER — FAMOTIDINE 20 MG PO TABS
20.0000 mg | ORAL_TABLET | Freq: Once | ORAL | Status: AC
  Administered 2018-01-29: 20 mg via ORAL
  Filled 2018-01-29: qty 1

## 2018-01-29 MED ORDER — DIPHENHYDRAMINE HCL 12.5 MG/5ML PO ELIX: 12.5000 mg | ORAL_SOLUTION | Freq: Four times a day (QID) | ORAL | Status: DC | PRN

## 2018-01-29 MED ORDER — DEXAMETHASONE SODIUM PHOSPHATE 10 MG/ML IJ SOLN
INTRAMUSCULAR | Status: DC | PRN
  Administered 2018-01-29: 4 mg via INTRAVENOUS

## 2018-01-29 MED ORDER — HYDROMORPHONE HCL 1 MG/ML IJ SOLN
INTRAMUSCULAR | Status: AC
  Filled 2018-01-29: qty 1

## 2018-01-29 MED ORDER — SODIUM CHLORIDE 0.9% FLUSH: 9.0000 mL | INTRAVENOUS | Status: DC | PRN

## 2018-01-29 MED ORDER — ACETAMINOPHEN 500 MG PO TABS
1000.0000 mg | ORAL_TABLET | Freq: Once | ORAL | Status: AC
  Administered 2018-01-29: 1000 mg via ORAL
  Filled 2018-01-29: qty 2

## 2018-01-29 MED ORDER — KETOROLAC TROMETHAMINE 30 MG/ML IJ SOLN
30.0000 mg | Freq: Four times a day (QID) | INTRAMUSCULAR | Status: DC
  Administered 2018-01-29 – 2018-01-30 (×2): 30 mg via INTRAVENOUS
  Filled 2018-01-29: qty 1

## 2018-01-29 MED ORDER — HYDROCODONE-ACETAMINOPHEN 5-325 MG PO TABS
1.0000 | ORAL_TABLET | ORAL | Status: DC | PRN
  Administered 2018-01-30: 2 via ORAL
  Filled 2018-01-29: qty 2

## 2018-01-29 MED ORDER — BUPIVACAINE-EPINEPHRINE (PF) 0.25% -1:200000 IJ SOLN
INTRAMUSCULAR | Status: DC | PRN
  Administered 2018-01-29: 18 mL

## 2018-01-29 MED ORDER — GLYCOPYRROLATE 0.2 MG/ML IJ SOLN
INTRAMUSCULAR | Status: DC | PRN
  Administered 2018-01-29: 0.1 mg via INTRAVENOUS

## 2018-01-29 MED ORDER — KETOROLAC TROMETHAMINE 30 MG/ML IJ SOLN
30.0000 mg | Freq: Four times a day (QID) | INTRAMUSCULAR | Status: DC
  Filled 2018-01-29: qty 1

## 2018-01-29 MED ORDER — LACTATED RINGERS IV SOLN
INTRAVENOUS | Status: DC
  Administered 2018-01-29 (×3): via INTRAVENOUS

## 2018-01-29 MED ORDER — HYDROMORPHONE HCL 1 MG/ML IJ SOLN
INTRAMUSCULAR | Status: AC
  Administered 2018-01-29: 0.5 mg via INTRAVENOUS
  Filled 2018-01-29: qty 1

## 2018-01-29 MED ORDER — CELECOXIB 200 MG PO CAPS
200.0000 mg | ORAL_CAPSULE | Freq: Once | ORAL | Status: DC | PRN
  Filled 2018-01-29: qty 1

## 2018-01-29 MED ORDER — MIDAZOLAM HCL 2 MG/2ML IJ SOLN
INTRAMUSCULAR | Status: AC
  Filled 2018-01-29: qty 2

## 2018-01-29 MED ORDER — ACETAMINOPHEN 160 MG/5ML PO SOLN
960.0000 mg | Freq: Once | ORAL | Status: AC
  Filled 2018-01-29: qty 30

## 2018-01-29 MED ORDER — MENTHOL 3 MG MT LOZG: 1.0000 | LOZENGE | OROMUCOSAL | Status: DC | PRN

## 2018-01-29 MED ORDER — OXYCODONE HCL 5 MG PO TABS: 5.0000 mg | ORAL_TABLET | Freq: Once | ORAL | Status: DC | PRN

## 2018-01-29 MED ORDER — ONDANSETRON HCL 4 MG/2ML IJ SOLN: 4.0000 mg | Freq: Four times a day (QID) | INTRAMUSCULAR | Status: DC | PRN

## 2018-01-29 MED ORDER — DEXAMETHASONE SODIUM PHOSPHATE 4 MG/ML IJ SOLN
INTRAMUSCULAR | Status: AC
Start: 2018-01-29 — End: ?
  Filled 2018-01-29: qty 1

## 2018-01-29 MED ORDER — DEXMEDETOMIDINE BOLUS VIA INFUSION
0.2000 ug/kg | Freq: Once | INTRAVENOUS | Status: AC
  Administered 2018-01-29: 19.92 ug via INTRAVENOUS

## 2018-01-29 MED ORDER — AMPICILLIN-SULBACTAM SODIUM 3 (2-1) G IJ SOLR
3.0000 g | INTRAMUSCULAR | Status: AC
  Administered 2018-01-29: 3 g via INTRAVENOUS
  Filled 2018-01-29: qty 3

## 2018-01-29 MED ORDER — ONDANSETRON HCL 4 MG/2ML IJ SOLN
INTRAMUSCULAR | Status: AC
  Filled 2018-01-29: qty 2

## 2018-01-29 MED ORDER — SODIUM CHLORIDE 0.9 % IV SOLN
3.0000 g | Freq: Once | INTRAVENOUS | Status: AC
  Administered 2018-01-30: 3 g via INTRAVENOUS
  Filled 2018-01-29: qty 3

## 2018-01-29 MED ORDER — LIDOCAINE HCL (CARDIAC) PF 100 MG/5ML IV SOSY
PREFILLED_SYRINGE | INTRAVENOUS | Status: DC | PRN
  Administered 2018-01-29: 100 mg via INTRAVENOUS

## 2018-01-29 MED ORDER — DIPHENHYDRAMINE HCL 50 MG/ML IJ SOLN: 12.5000 mg | Freq: Four times a day (QID) | INTRAMUSCULAR | Status: DC | PRN

## 2018-01-29 MED ORDER — PROPOFOL 10 MG/ML IV BOLUS
INTRAVENOUS | Status: DC | PRN
  Administered 2018-01-29: 200 mg via INTRAVENOUS

## 2018-01-29 MED ORDER — OXYCODONE HCL 5 MG/5ML PO SOLN: 5.0000 mg | Freq: Once | ORAL | Status: DC | PRN

## 2018-01-29 MED ORDER — LACTATED RINGERS IV SOLN
INTRAVENOUS | Status: DC
  Administered 2018-01-30: 125 mL/h via INTRAVENOUS

## 2018-01-29 MED ORDER — ONDANSETRON HCL 4 MG/2ML IJ SOLN
INTRAMUSCULAR | Status: DC | PRN
  Administered 2018-01-29: 4 mg via INTRAVENOUS

## 2018-01-29 MED ORDER — KETOROLAC TROMETHAMINE 30 MG/ML IJ SOLN
INTRAMUSCULAR | Status: AC
  Filled 2018-01-29: qty 1

## 2018-01-29 MED ORDER — FENTANYL CITRATE (PF) 100 MCG/2ML IJ SOLN
INTRAMUSCULAR | Status: DC | PRN
  Administered 2018-01-29 (×4): 50 ug via INTRAVENOUS

## 2018-01-29 MED ORDER — PROMETHAZINE HCL 25 MG/ML IJ SOLN
INTRAMUSCULAR | Status: AC
  Administered 2018-01-29: 6.25 mg via INTRAVENOUS
  Filled 2018-01-29: qty 1

## 2018-01-29 MED ORDER — PROMETHAZINE HCL 25 MG/ML IJ SOLN
6.2500 mg | INTRAMUSCULAR | Status: DC | PRN
  Administered 2018-01-29: 6.25 mg via INTRAVENOUS

## 2018-01-29 MED ORDER — NALOXONE HCL 0.4 MG/ML IJ SOLN: 0.4000 mg | INTRAMUSCULAR | Status: DC | PRN

## 2018-01-29 MED ORDER — HYDROMORPHONE 1 MG/ML IV SOLN
INTRAVENOUS | Status: DC
  Administered 2018-01-29: 2.7 mg via INTRAVENOUS
  Administered 2018-01-29: 20:00:00 via INTRAVENOUS
  Administered 2018-01-30: 3 mg via INTRAVENOUS
  Administered 2018-01-30: 1.8 mg via INTRAVENOUS

## 2018-01-29 MED ORDER — ACETAMINOPHEN 325 MG PO TABS
650.0000 mg | ORAL_TABLET | Freq: Four times a day (QID) | ORAL | Status: DC | PRN
  Administered 2018-01-30: 650 mg via ORAL
  Filled 2018-01-29: qty 2

## 2018-01-29 MED ORDER — PROPOFOL 10 MG/ML IV BOLUS
INTRAVENOUS | Status: AC
  Filled 2018-01-29: qty 20

## 2018-01-29 MED ORDER — HYDROMORPHONE HCL 1 MG/ML IJ SOLN
0.2500 mg | INTRAMUSCULAR | Status: DC | PRN
  Administered 2018-01-29 (×7): 0.5 mg via INTRAVENOUS

## 2018-01-29 MED ORDER — ZOLPIDEM TARTRATE 5 MG PO TABS: 5.0000 mg | ORAL_TABLET | Freq: Every evening | ORAL | Status: DC | PRN

## 2018-01-29 MED ORDER — MIDAZOLAM HCL 2 MG/2ML IJ SOLN
INTRAMUSCULAR | Status: DC | PRN
  Administered 2018-01-29: 2 mg via INTRAVENOUS

## 2018-01-29 MED ORDER — ONDANSETRON HCL 4 MG PO TABS
4.0000 mg | ORAL_TABLET | Freq: Four times a day (QID) | ORAL | Status: DC | PRN
Start: 2018-01-29 — End: 2018-01-30

## 2018-01-29 SURGICAL SUPPLY — 33 items
BENZOIN TINCTURE PRP APPL 2/3 (GAUZE/BANDAGES/DRESSINGS) ×3 IMPLANT
CATH ROBINSON RED A/P 14FR (CATHETERS) IMPLANT
CLOSURE WOUND 1/2 X4 (GAUZE/BANDAGES/DRESSINGS) ×1
COUNTER NEEDLE 1200 MAGNETIC (NEEDLE) IMPLANT
DERMABOND ADHESIVE PROPEN (GAUZE/BANDAGES/DRESSINGS) ×2
DERMABOND ADVANCED .7 DNX6 (GAUZE/BANDAGES/DRESSINGS) ×1 IMPLANT
DRAIN JACKSON PRT FLT 7MM (DRAIN) ×3 IMPLANT
DRSG OPSITE POSTOP 4X10 (GAUZE/BANDAGES/DRESSINGS) ×3 IMPLANT
ELECT REM PT RETURN 9FT ADLT (ELECTROSURGICAL)
ELECTRODE REM PT RTRN 9FT ADLT (ELECTROSURGICAL) IMPLANT
EVACUATOR SILICONE 100CC (DRAIN) ×3 IMPLANT
GLOVE BIO SURGEON STRL SZ 6.5 (GLOVE) ×2 IMPLANT
GLOVE BIO SURGEON STRL SZ7 (GLOVE) ×3 IMPLANT
GLOVE BIO SURGEONS STRL SZ 6.5 (GLOVE) ×1
GLOVE BIOGEL PI IND STRL 7.0 (GLOVE) ×3 IMPLANT
GLOVE BIOGEL PI INDICATOR 7.0 (GLOVE) ×6
GOWN STRL REUS W/TWL LRG LVL3 (GOWN DISPOSABLE) ×9 IMPLANT
NEEDLE HYPO 22GX1.5 SAFETY (NEEDLE) ×3 IMPLANT
PACK ABDOMINAL GYN (CUSTOM PROCEDURE TRAY) ×3 IMPLANT
PAD OB MATERNITY 4.3X12.25 (PERSONAL CARE ITEMS) IMPLANT
PENCIL BUTTON HOLSTER BLD 10FT (ELECTRODE) IMPLANT
SPONGE DRAIN TRACH 4X4 STRL 2S (GAUZE/BANDAGES/DRESSINGS) ×3 IMPLANT
SPONGE LAP 18X18 X RAY DECT (DISPOSABLE) ×3 IMPLANT
STRIP CLOSURE SKIN 1/2X4 (GAUZE/BANDAGES/DRESSINGS) ×2 IMPLANT
SUT PDS AB 0 CTX 60 (SUTURE) ×3 IMPLANT
SUT PLAIN 2 0 (SUTURE) ×4
SUT PLAIN ABS 2-0 CT1 27XMFL (SUTURE) ×2 IMPLANT
SUT SILK 2 0 SH (SUTURE) ×3 IMPLANT
SUT VIC AB 0 CT1 27 (SUTURE) ×2
SUT VIC AB 0 CT1 27XBRD ANBCTR (SUTURE) ×1 IMPLANT
SUT VIC AB 4-0 KS 27 (SUTURE) ×3 IMPLANT
SYR CONTROL 10ML LL (SYRINGE) ×3 IMPLANT
TOWEL OR 17X24 6PK STRL BLUE (TOWEL DISPOSABLE) ×3 IMPLANT

## 2018-01-29 NOTE — Anesthesia Preprocedure Evaluation (Addendum)
Anesthesia Evaluation  Patient identified by MRN, date of birth, ID band Patient awake    Reviewed: Allergy & Precautions, NPO status , Patient's Chart, lab work & pertinent test results  Airway Mallampati: III  TM Distance: >3 FB Neck ROM: Full    Dental no notable dental hx.    Pulmonary neg pulmonary ROS,    Pulmonary exam normal breath sounds clear to auscultation       Cardiovascular negative cardio ROS Normal cardiovascular exam Rhythm:Regular Rate:Normal     Neuro/Psych negative neurological ROS  negative psych ROS   GI/Hepatic negative GI ROS, Neg liver ROS,   Endo/Other  negative endocrine ROS  Renal/GU negative Renal ROS     Musculoskeletal negative musculoskeletal ROS (+)   Abdominal (+) + obese,   Peds  Hematology  (+) anemia ,   Anesthesia Other Findings Fascial defect s/p Cesarean Section   Reproductive/Obstetrics                            Anesthesia Physical Anesthesia Plan  ASA: II  Anesthesia Plan: General   Post-op Pain Management:    Induction: Intravenous  PONV Risk Score and Plan: 3 and Ondansetron, Dexamethasone, Treatment may vary due to age or medical condition and Midazolam  Airway Management Planned: LMA  Additional Equipment:   Intra-op Plan:   Post-operative Plan: Extubation in OR  Informed Consent: I have reviewed the patients History and Physical, chart, labs and discussed the procedure including the risks, benefits and alternatives for the proposed anesthesia with the patient or authorized representative who has indicated his/her understanding and acceptance.   Dental advisory given  Plan Discussed with: CRNA  Anesthesia Plan Comments:        Anesthesia Quick Evaluation

## 2018-01-29 NOTE — Transfer of Care (Signed)
Immediate Anesthesia Transfer of Care Note  Patient: Stephanie Delgado  Procedure(s) Performed: ABDOMINAL SCAR REVISION (N/A )  Patient Location: PACU  Anesthesia Type:General  Level of Consciousness: awake, alert  and oriented  Airway & Oxygen Therapy: Patient Spontanous Breathing and Patient connected to nasal cannula oxygen  Post-op Assessment: Report given to RN, Post -op Vital signs reviewed and stable and Patient moving all extremities  Post vital signs: Reviewed and stable  Last Vitals:  Vitals Value Taken Time  BP 137/91 01/29/2018  5:54 PM  Temp    Pulse 106 01/29/2018  5:55 PM  Resp 16 01/29/2018  5:55 PM  SpO2 100 % 01/29/2018  5:55 PM  Vitals shown include unvalidated device data.  Last Pain:  Vitals:   01/29/18 1615  TempSrc:   PainSc: 0-No pain         Complications: No apparent anesthesia complications

## 2018-01-29 NOTE — H&P (Signed)
Stephanie Delgado is an 37 y.o. female Para 1 s/p cesarean delivery 7/11 with incisional hematoma opened in office.  It was determined in the office via probe that the fascia has a small defect  Pertinent Gynecological History: Menses: lactating Bleeding: peripartum bleeding Contraception: abstinence DES exposure: denies Blood transfusions: none Sexually transmitted diseases: no past history Previous GYN Procedures: c-section  Last mammogram: n/a  Last pap: normal Date: 2018 OB History: G1, P1   Menstrual History: Menarche age: 56 No LMP recorded.    Past Medical History:  Diagnosis Date  . Infertility, female    27yrs of infertility- had spontaneous preg  . Medical history non-contributory     Past Surgical History:  Procedure Laterality Date  . CESAREAN SECTION N/A 01/07/2018   Procedure: CESAREAN SECTION;  Surgeon: Sanjuana Kava, MD;  Location: [place];  Service: Obstetrics;  Laterality: N/A;  . CYST REMOVAL TRUNK     back  . TONSILLECTOMY AND ADENOIDECTOMY    . WISDOM TOOTH EXTRACTION      Family History  Problem Relation Age of Onset  . Hypertension Mother   . Hypertension Father   . Diabetes Maternal Grandmother   . Hypertension Maternal Grandmother   . Thrombosis Maternal Grandmother   . Hyperlipidemia Maternal Grandmother   . Arthritis Maternal Grandmother     Social History:  reports that she has never smoked. She has never used smokeless tobacco. She reports that she does not drink alcohol or use drugs.  Allergies: No Known Allergies  Medications Prior to Admission  Medication Sig Dispense Refill Last Dose  . acetaminophen (TYLENOL) 500 MG tablet Take 1,000 mg by mouth every 6 (six) hours as needed for mild pain.   01/29/2018 at 0800  . ferrous sulfate 325 (65 FE) MG tablet Take 1 tablet (325 mg total) by mouth 2 (two) times daily with a meal. 60 tablet 3 01/28/2018 at Unknown time  . NIFEdipine (PROCARDIA-XL/ADALAT CC) 30 MG 24 hr tablet Take 1  tablet (30 mg total) by mouth daily. 30 tablet 1 01/29/2018 at Unknown time  . Prenatal Vit-Fe Fumarate-FA (PRENATAL MULTIVITAMIN) TABS tablet Take 1 tablet by mouth daily at 12 noon.   Past Week at Unknown time    ROS  Blood pressure 133/79, pulse 79, temperature 98.3 F (36.8 C), temperature source Oral, resp. rate 17, weight 219 lb 8 oz (99.6 kg), SpO2 98 %, not currently breastfeeding. Physical Exam Pfannenstiel incision with midline opening Fascial defect  Results for orders placed or performed during the hospital encounter of 01/29/18 (from the past 24 hour(s))  Type and screen [place]     Status: None   Collection Time: 01/29/18 12:31 PM  Result Value Ref Range   ABO/RH(D) O POS    Antibody Screen NEG    Sample Expiration      02/01/2018 Performed at [facility], [address], [place], [place] [address]   CBC     Status: Abnormal   Collection Time: 01/29/18 12:31 PM  Result Value Ref Range   WBC 6.3 4.0 - 10.5 K/uL   RBC 4.02 3.87 - 5.11 MIL/uL   Hemoglobin 11.5 (L) 12.0 - 15.0 g/dL   HCT 34.7 (L) 36.0 - 46.0 %   MCV 86.3 78.0 - 100.0 fL   MCH 28.6 26.0 - 34.0 pg   MCHC 33.1 30.0 - 36.0 g/dL   RDW 14.9 11.5 - 15.5 %   Platelets 312 150 - 400 K/uL  No results found.  Assessment/Plan: 37 year old with fascial dehiscence On call to OR for re-opening of incision and repair  Unasyn IV on call to OR for surgical prophylaxis Discussed risks with patient of procedure to include bleeding, infection etc She voiced understanding patient signed consent as it was witnessed and it was placed into chart  [place], Stacy 01/29/2018, 4:11 PM

## 2018-01-29 NOTE — Anesthesia Procedure Notes (Signed)
Procedure Name: LMA Insertion Date/Time: 01/29/2018 4:29 PM Performed by: Hewitt Blade, CRNA Pre-anesthesia Checklist: Patient identified, Emergency Drugs available, Suction available and Patient being monitored Patient Re-evaluated:Patient Re-evaluated prior to induction Oxygen Delivery Method: Circle system utilized Preoxygenation: Pre-oxygenation with 100% oxygen Induction Type: IV induction LMA: LMA inserted LMA Size: 4.0 Number of attempts: 1 Placement Confirmation: positive ETCO2 and breath sounds checked- equal and bilateral Tube secured with: Tape Dental Injury: Teeth and Oropharynx as per pre-operative assessment

## 2018-01-29 NOTE — Op Note (Signed)
OPERATIVE NOTE  Stephanie Delgado  DOB:    12-Apr-1981  MRN:    [id number]  CSN:    [id number]  Date of Surgery:  01/29/2018  Pre-Operative Diagnosis: Fascial Dehiscence  Postoperative diagnoses: same as above  Procedure:  Fascial Repair and closure of incision  Surgeon:  Sanjuana Kava MD  Assistant: Delsa Bern MD  Specimen: none  EBL: 696 mL  Complications: none.  Anesthesia:  General LMA  Operative findings: Necrotic fascial tissue, moderate amount of old blood evacuated. Fascial layer closed and JP drain placed.   Technique:  After adequate general anesthesia was achieved, the patient was prepped and draped in the usual sterile fashion in dorsal supine fashion.  Pfannenstiel incision was made along previous scar with the 10 blade scalpel and carried down to the fascia.  The fascia dehisced further just by palpation and the subcutaneous tissue above the rectus muscle was irrigated thoroughly with sterile normal saline. There were small bleeders on the rectus muscle that were cauterized with the Bovie.    The necrotic fascial tissue was removed and the healthy fascial tissue was closed in running fashion with 0 looped PDS.  A Stephanie Delgado drain was placed between the rectus layer and the fascial layer and stitched in placed with 2-0 silk.   The subcutaneous tissue was closed with interrupted stitches of 2-0 plain gut after hemostasis was achieved with the bovie and irrigation.  The skin was closed with 4-0 vicryl sub-q with a keith needle. Benzoin, steri strips and a honeycomb bandage was placed.  Sponge, needle and instrument counts were correct x 3.  Pt tolerated the procedure well and was transferred to the recovery room in stable condition.    Kenston Longton [name]

## 2018-01-29 NOTE — MAU Note (Signed)
Sent from office to prep for 1600 scheduled surgery.

## 2018-01-30 ENCOUNTER — Encounter (HOSPITAL_COMMUNITY): Payer: Self-pay | Admitting: Obstetrics & Gynecology

## 2018-01-30 DIAGNOSIS — E669 Obesity, unspecified: Secondary | ICD-10-CM | POA: Diagnosis not present

## 2018-01-30 DIAGNOSIS — Z6832 Body mass index (BMI) 32.0-32.9, adult: Secondary | ICD-10-CM | POA: Diagnosis not present

## 2018-01-30 DIAGNOSIS — O900 Disruption of cesarean delivery wound: Secondary | ICD-10-CM | POA: Diagnosis not present

## 2018-01-30 DIAGNOSIS — L7682 Other postprocedural complications of skin and subcutaneous tissue: Secondary | ICD-10-CM | POA: Diagnosis not present

## 2018-01-30 DIAGNOSIS — I96 Gangrene, not elsewhere classified: Secondary | ICD-10-CM | POA: Diagnosis not present

## 2018-01-30 LAB — CBC
HEMATOCRIT: 31.7 % — AB (ref 36.0–46.0)
HEMOGLOBIN: 10.3 g/dL — AB (ref 12.0–15.0)
MCH: 28.7 pg (ref 26.0–34.0)
MCHC: 32.5 g/dL (ref 30.0–36.0)
MCV: 88.3 fL (ref 78.0–100.0)
Platelets: 268 10*3/uL (ref 150–400)
RBC: 3.59 MIL/uL — ABNORMAL LOW (ref 3.87–5.11)
RDW: 15 % (ref 11.5–15.5)
WBC: 9 10*3/uL (ref 4.0–10.5)

## 2018-01-30 MED ORDER — ONDANSETRON HCL 4 MG PO TABS: 4.0000 mg | ORAL_TABLET | Freq: Four times a day (QID) | ORAL | 0 refills | Status: AC | PRN

## 2018-01-30 MED ORDER — IBUPROFEN 800 MG PO TABS: 800.0000 mg | ORAL_TABLET | Freq: Three times a day (TID) | ORAL | 0 refills | Status: AC | PRN

## 2018-01-30 MED ORDER — HYDROCODONE-ACETAMINOPHEN 5-325 MG PO TABS: 1.0000 | ORAL_TABLET | ORAL | 0 refills | Status: AC | PRN

## 2018-01-30 MED ORDER — CEPHALEXIN 500 MG PO CAPS: 500.0000 mg | ORAL_CAPSULE | Freq: Four times a day (QID) | ORAL | 0 refills | Status: AC

## 2018-01-30 NOTE — Progress Notes (Signed)
1 Day Post-Op Procedure(s) (LRB): ABDOMINAL SCAR REVISION (N/A)  Subjective: Patient reports tolerating PO, + flatus and no problems voiding.   Some incisional pain, but well controlled.   Objective: I have reviewed patient's vital signs, intake and output and labs.  General: alert, cooperative and no distress GI: soft, non-tender; bowel sounds normal; no masses,  no organomegaly and incision: clean, dry and intact Extremities: extremities normal, atraumatic, no cyanosis or edema  Assessment: s/p Procedure(s): ABDOMINAL SCAR REVISION (N/A): progressing well  Plan: Discharge home  LOS: 0 days    Stephanie Delgado, Stephanie Delgado 01/30/2018, 9:57 AM

## 2018-01-30 NOTE — Progress Notes (Signed)
13cc  Hydromorphone PCA wasted in sink. Witnessed by Ria Bush and Danny Lawless, RN. Toya Smothers, RN

## 2018-01-30 NOTE — Anesthesia Postprocedure Evaluation (Signed)
Anesthesia Post Note  Patient: Stephanie Delgado  Procedure(s) Performed: ABDOMINAL SCAR REVISION (N/A )     Patient location during evaluation: PACU Anesthesia Type: General Level of consciousness: awake and alert Pain management: pain level controlled Vital Signs Assessment: post-procedure vital signs reviewed and stable Respiratory status: spontaneous breathing, nonlabored ventilation, respiratory function stable and patient connected to nasal cannula oxygen Cardiovascular status: blood pressure returned to baseline and stable Postop Assessment: no apparent nausea or vomiting Anesthetic complications: no    Last Vitals:  Vitals:   01/30/18 0507 01/30/18 0513  BP: 118/76   Pulse: 63   Resp: 15 16  Temp: 36.7 C   SpO2: 100% 97%    Last Pain:  Vitals:   01/30/18 0648  TempSrc:   PainSc: 0-No pain                 Ziere Docken P [facility]

## 2018-01-30 NOTE — Discharge Summary (Signed)
Physician Discharge Summary  Patient ID: Stephanie Delgado MRN: [id number] DOB/AGE: [birth year] 37 y.o.  Admit date: 01/29/2018 Discharge date: 01/30/2018  Admission Diagnoses: Fascial dehiscence  Discharge Diagnoses:  Active Problems:   Fascial defect   Discharged Condition: good  Hospital Course: Patient taken to operating room where fascial defect repaired.  Patient progressed well and was discharge home on POD#1  Consults: None  Significant Diagnostic Studies: labs: Post op Hb 10  Treatments: IV hydration and antibiotics: Unasyn  Discharge Exam: Blood pressure 135/88, pulse (!) 106, temperature 98.2 F (36.8 C), temperature source Oral, resp. rate 18, height 5\' 9"  (1.753 m), weight 218 lb (98.9 kg), SpO2 98 %, not currently breastfeeding. General: alert, cooperative and no distress GI: soft, non-tender; bowel sounds normal; no masses,  no organomegaly and incision: clean, dry and intact Extremities: extremities normal, atraumatic, no cyanosis or edema Disposition: Discharge disposition: 01-Home or Self Care       Discharge Instructions     Remove dressing in 72 hours   Complete by:  As directed    Call MD for:   Complete by:  As directed    Abnormal foul smelling vaginal discharge or heavy vaginal bleeding   Call MD for:  difficulty breathing, headache or visual disturbances   Complete by:  As directed    Call MD for:  extreme fatigue   Complete by:  As directed    Call MD for:  hives   Complete by:  As directed    Call MD for:  persistant dizziness or light-headedness   Complete by:  As directed    Call MD for:  persistant nausea and vomiting   Complete by:  As directed    Call MD for:  redness, tenderness, or signs of infection (pain, swelling, redness, odor or green/yellow discharge around incision site)   Complete by:  As directed    Call MD for:  severe uncontrolled pain   Complete by:  As directed    Call MD for:  temperature >100.4   Complete by:  As  directed    Diet - low sodium heart healthy   Complete by:  As directed    Discharge instructions   Complete by:  As directed    Call Dr. Alwyn Pea or [facility] office if you have any questions or concerns.   Driving Restrictions   Complete by:  As directed    No driving for 10 days or while taking percocet   Increase activity slowly   Complete by:  As directed    Lifting restrictions   Complete by:  As directed    No heavy lifting, nothing heavier than a gallon a milk or approximately 20 pounds     Allergies as of 01/30/2018   No Known Allergies     Medication List    STOP taking these medications   acetaminophen 500 MG tablet Commonly known as:  TYLENOL     TAKE these medications   ferrous sulfate 325 (65 FE) MG tablet Take 1 tablet (325 mg total) by mouth 2 (two) times daily with a meal.   HYDROcodone-acetaminophen 5-325 MG tablet Commonly known as:  NORCO/VICODIN Take 1-2 tablets by mouth every 4 (four) hours as needed for moderate pain.   ibuprofen 800 MG tablet Commonly known as:  ADVIL,MOTRIN Take 1 tablet (800 mg total) by mouth every 8 (eight) hours as needed.   NIFEdipine 30 MG 24 hr tablet Commonly known as:  PROCARDIA-XL/ADALAT CC Take 1 tablet (  30 mg total) by mouth daily.   ondansetron 4 MG tablet Commonly known as:  ZOFRAN Take 1 tablet (4 mg total) by mouth every 6 (six) hours as needed for nausea.   prenatal multivitamin Tabs tablet Take 1 tablet by mouth daily at 12 noon.        SignedCaffie Damme 01/30/2018, 10:03 AM

## 2018-02-11 DIAGNOSIS — Z08 Encounter for follow-up examination after completed treatment for malignant neoplasm: Secondary | ICD-10-CM | POA: Diagnosis not present

## 2018-03-10 DIAGNOSIS — Z30430 Encounter for insertion of intrauterine contraceptive device: Secondary | ICD-10-CM | POA: Diagnosis not present

## 2018-04-16 DIAGNOSIS — Z7689 Persons encountering health services in other specified circumstances: Secondary | ICD-10-CM | POA: Diagnosis not present

## 2018-04-16 DIAGNOSIS — Z79899 Other long term (current) drug therapy: Secondary | ICD-10-CM | POA: Diagnosis not present

## 2018-04-16 DIAGNOSIS — E669 Obesity, unspecified: Secondary | ICD-10-CM | POA: Diagnosis not present

## 2018-04-23 MED FILL — SAXENDA 18 MG/3 ML PEN: 18 | 30 days supply | Qty: 15 | Fill #0

## 2018-05-11 DIAGNOSIS — E538 Deficiency of other specified B group vitamins: Secondary | ICD-10-CM | POA: Diagnosis not present

## 2018-05-11 DIAGNOSIS — R5383 Other fatigue: Secondary | ICD-10-CM | POA: Diagnosis not present

## 2018-05-11 DIAGNOSIS — F419 Anxiety disorder, unspecified: Secondary | ICD-10-CM | POA: Diagnosis not present

## 2018-05-11 DIAGNOSIS — Z975 Presence of (intrauterine) contraceptive device: Secondary | ICD-10-CM | POA: Diagnosis not present

## 2018-05-11 DIAGNOSIS — L659 Nonscarring hair loss, unspecified: Secondary | ICD-10-CM | POA: Diagnosis not present

## 2018-05-11 DIAGNOSIS — R208 Other disturbances of skin sensation: Secondary | ICD-10-CM | POA: Diagnosis not present

## 2018-05-11 MED FILL — buPROPion HCL ER (XL) 150 M: 150 | 30 days supply | Qty: 30 | Fill #0

## 2018-05-22 MED FILL — SAXENDA 18 MG/3 ML PEN: 18 | 30 days supply | Qty: 15 | Fill #1

## 2018-05-26 DIAGNOSIS — F419 Anxiety disorder, unspecified: Secondary | ICD-10-CM | POA: Diagnosis not present

## 2018-05-26 DIAGNOSIS — Z7689 Persons encountering health services in other specified circumstances: Secondary | ICD-10-CM | POA: Diagnosis not present

## 2018-05-26 DIAGNOSIS — R208 Other disturbances of skin sensation: Secondary | ICD-10-CM | POA: Diagnosis not present

## 2018-06-09 MED FILL — buPROPion HCL ER (XL) 150 M: 150 | 90 days supply | Qty: 90 | Fill #0

## 2018-06-17 MED FILL — SAXENDA 18 MG/3 ML PEN: 18 | 30 days supply | Qty: 15 | Fill #2

## 2018-08-27 DIAGNOSIS — F419 Anxiety disorder, unspecified: Secondary | ICD-10-CM | POA: Diagnosis not present

## 2018-08-27 DIAGNOSIS — Z0000 Encounter for general adult medical examination without abnormal findings: Secondary | ICD-10-CM | POA: Diagnosis not present

## 2018-11-25 DIAGNOSIS — F419 Anxiety disorder, unspecified: Secondary | ICD-10-CM | POA: Diagnosis not present

## 2018-11-25 DIAGNOSIS — R208 Other disturbances of skin sensation: Secondary | ICD-10-CM | POA: Diagnosis not present

## 2018-12-14 DIAGNOSIS — R208 Other disturbances of skin sensation: Secondary | ICD-10-CM | POA: Diagnosis not present

## 2018-12-16 DIAGNOSIS — F419 Anxiety disorder, unspecified: Secondary | ICD-10-CM | POA: Diagnosis not present

## 2018-12-16 DIAGNOSIS — R208 Other disturbances of skin sensation: Secondary | ICD-10-CM | POA: Diagnosis not present

## 2019-01-04 MED FILL — buPROPion HCL ER (XL) 300 M: 300 | 30 days supply | Qty: 30 | Fill #0

## 2019-02-01 MED FILL — buPROPion HCL ER (XL) 300 M: 300 | 90 days supply | Qty: 90 | Fill #0

## 2019-02-10 DIAGNOSIS — F419 Anxiety disorder, unspecified: Secondary | ICD-10-CM | POA: Diagnosis not present

## 2019-03-10 DIAGNOSIS — M25461 Effusion, right knee: Secondary | ICD-10-CM | POA: Diagnosis not present

## 2019-05-02 MED FILL — buPROPion HCL ER (XL) 300 M: 300 | 90 days supply | Qty: 90 | Fill #1

## 2019-05-26 DIAGNOSIS — F419 Anxiety disorder, unspecified: Secondary | ICD-10-CM | POA: Diagnosis not present

## 2019-08-03 MED FILL — BUPROPION HCL XL 300 MG TAB: 300 | 30 days supply | Qty: 30 | Fill #0

## 2019-08-30 MED FILL — BuPROPion HCL ER (XL) 300 M: 300 | 30 days supply | Qty: 30 | Fill #1

## 2020-12-07 ENCOUNTER — Ambulatory Visit: Payer: Self-pay

## 2020-12-07 ENCOUNTER — Ambulatory Visit: Payer: Commercial Managed Care - PPO | Admitting: Family Medicine

## 2020-12-07 ENCOUNTER — Other Ambulatory Visit: Payer: Self-pay

## 2020-12-07 VITALS — BP 136/80 | Ht 69.0 in | Wt 190.0 lb

## 2020-12-07 DIAGNOSIS — G5631 Lesion of radial nerve, right upper limb: Secondary | ICD-10-CM | POA: Insufficient documentation

## 2020-12-07 DIAGNOSIS — M791 Myalgia, unspecified site: Secondary | ICD-10-CM | POA: Diagnosis not present

## 2020-12-07 DIAGNOSIS — M25531 Pain in right wrist: Secondary | ICD-10-CM

## 2020-12-07 MED ORDER — PREDNISONE 5 MG PO TABS: ORAL_TABLET | ORAL | 0 refills | Status: AC

## 2020-12-07 NOTE — Assessment & Plan Note (Signed)
Having pain in the midportion of the forearm.  She does work out on a regular basis and for extended period of time.  Seems less likely for tennis elbow given the location and exam today.  Less likely for supinator syndrome or compartment syndrome.  Possible for a mild case of rhabdo as she does have some soft tissue swelling but no overt changes. -Counseled on home exercise therapy and supportive care. -Prednisone. -Counseled on refraining from activities that exacerbate her symptoms. -Could consider lab testing or physical therapy.

## 2020-12-07 NOTE — Patient Instructions (Signed)
Nice to meet you Please try to adjust your work outs to movements that don't cause pain  Please try heat as needed  Please send me a message in MyChart with any questions or updates.  Please see me back in 4 weeks.   --Dr. Raeford Razor

## 2020-12-07 NOTE — Progress Notes (Signed)
Stephanie Delgado - 40 y.o. female MRN [id number]  Date of birth: Dec 29, 1980  SUBJECTIVE:  Including CC & ROS.  No chief complaint on file.   Stephanie Delgado is a 40 y.o. female that is presenting with right forearm pain.  The pain is occurring over the posterior aspect.  Does not extend down to the wrist or hand.  Denies any numbness or tingling.  Occurs mainly with a clean and jerk as well as hammer curls.  She does work out about 2 hours/day 7 days a week.  She is training in a [employer] competition.   Review of Systems See HPI   HISTORY: Past Medical, Surgical, Social, and Family History Reviewed & Updated per EMR.   Pertinent Historical Findings include:  Past Medical History:  Diagnosis Date   Infertility, female    68yrs of infertility- had spontaneous preg   Medical history non-contributory     Past Surgical History:  Procedure Laterality Date   CESAREAN SECTION N/A 01/07/2018   Procedure: CESAREAN SECTION;  Surgeon: Sanjuana Kava, MD;  Location: [facility];  Service: Obstetrics;  Laterality: N/A;   CYST REMOVAL TRUNK     back   SCAR REVISION N/A 01/29/2018   Procedure: ABDOMINAL SCAR REVISION;  Surgeon: Sanjuana Kava, MD;  Location: [facility];  Service: Gynecology;  Laterality: N/A;   TONSILLECTOMY AND ADENOIDECTOMY     WISDOM TOOTH EXTRACTION      Family History  Problem Relation Age of Onset   Hypertension Mother    Hypertension Father    Diabetes Maternal Grandmother    Hypertension Maternal Grandmother    Thrombosis Maternal Grandmother    Hyperlipidemia Maternal Grandmother    Arthritis Maternal Grandmother     Social History   Socioeconomic History   Marital status: Married    Spouse name: Not on file   Number of children: Not on file   Years of education: Not on file   Highest education level: Not on file  Occupational History   Not on file  Tobacco Use   Smoking status: Never   Smokeless tobacco: Never  Substance and Sexual Activity   Alcohol  use: No   Drug use: No   Sexual activity: Not Currently    Birth control/protection: None  Other Topics Concern   Not on file  Social History Narrative   Not on file   Social Determinants of Health   Financial Resource Strain: Not on file  Food Insecurity: Not on file  Transportation Needs: Not on file  Physical Activity: Not on file  Stress: Not on file  Social Connections: Not on file  Intimate Partner Violence: Not on file     PHYSICAL EXAM:  VS: BP 136/80   Ht 5\' 9"  (1.753 m)   Wt 190 lb (86.2 kg)   BMI 28.06 kg/m  Physical Exam Gen: NAD, alert, cooperative with exam, well-appearing MSK:  Right elbow and wrist: No area of tenderness. Normal range of motion of the elbow. Normal supination and pronation. Normal grip strength. Neurovascular intact  Limited ultrasound: Right forearm:  Normal-appearing lateral epicondyles. No changes of the common extensor through the mid forearm. Nonspecific soft tissue swelling of the distal forearm. No tenosynovitis.  Summary: Nonspecific soft tissue swelling.  Ultrasound and interpretation by Clearance Coots, MD    ASSESSMENT & PLAN:   Muscle soreness Having pain in the midportion of the forearm.  She does work out on a regular basis and for extended period of time.  Seems less likely for tennis elbow given the location and exam today.  Less likely for supinator syndrome or compartment syndrome.  Possible for a mild case of rhabdo as she does have some soft tissue swelling but no overt changes. -Counseled on home exercise therapy and supportive care. -Prednisone. -Counseled on refraining from activities that exacerbate her symptoms. -Could consider lab testing or physical therapy.

## 2021-01-16 ENCOUNTER — Other Ambulatory Visit: Payer: Self-pay

## 2021-01-16 ENCOUNTER — Ambulatory Visit: Payer: Commercial Managed Care - PPO | Admitting: Family Medicine

## 2021-01-16 ENCOUNTER — Encounter: Payer: Self-pay | Admitting: Family Medicine

## 2021-01-16 ENCOUNTER — Ambulatory Visit (HOSPITAL_BASED_OUTPATIENT_CLINIC_OR_DEPARTMENT_OTHER)
Admission: RE | Admit: 2021-01-16 | Discharge: 2021-01-16 | Disposition: A | Discharge status: Home / Self Care | Payer: Commercial Managed Care - PPO | Source: Ambulatory Visit | Attending: Family Medicine | Admitting: Family Medicine

## 2021-01-16 DIAGNOSIS — G5631 Lesion of radial nerve, right upper limb: Secondary | ICD-10-CM

## 2021-01-16 MED ORDER — GABAPENTIN 100 MG PO CAPS: 100.0000 mg | ORAL_CAPSULE | Freq: Three times a day (TID) | ORAL | 1 refills | Status: AC

## 2021-01-16 NOTE — Assessment & Plan Note (Addendum)
Pain is worsening despite accommodations.  Seems to have a component of radicular pain.  Seems less likely for compartment syndrome or lateral epicondylitis.  Occurring at the mid forearm. -Counseled on home exercise therapy and supportive care. -Gabapentin. -Referral to physical therapy. -Referral for nerve study. - could consider injection

## 2021-01-16 NOTE — Progress Notes (Signed)
  Stephanie Delgado - 40 y.o. female MRN [id number]  Date of birth: September 24, 1980  SUBJECTIVE:  Including CC & ROS.  No chief complaint on file.   Stephanie Delgado is a 40 y.o. female that is presenting with acute worsening of her right forearm pain.  She has adjusted her weight lifting and the pain is still presenting.  She also presents with pain that goes from her neck down to her forearm.  Pain is worse at night at times.  It is occurring with any lifting..   Review of Systems See HPI   HISTORY: Past Medical, Surgical, Social, and Family History Reviewed & Updated per EMR.   Pertinent Historical Findings include:  Past Medical History:  Diagnosis Date   Infertility, female    33yrs of infertility- had spontaneous preg   Medical history non-contributory     Past Surgical History:  Procedure Laterality Date   CESAREAN SECTION N/A 01/07/2018   Procedure: CESAREAN SECTION;  Surgeon: Sanjuana Kava, MD;  Location: [facility];  Service: Obstetrics;  Laterality: N/A;   CYST REMOVAL TRUNK     back   SCAR REVISION N/A 01/29/2018   Procedure: ABDOMINAL SCAR REVISION;  Surgeon: Sanjuana Kava, MD;  Location: [facility];  Service: Gynecology;  Laterality: N/A;   TONSILLECTOMY AND ADENOIDECTOMY     WISDOM TOOTH EXTRACTION      Family History  Problem Relation Age of Onset   Hypertension Mother    Hypertension Father    Diabetes Maternal Grandmother    Hypertension Maternal Grandmother    Thrombosis Maternal Grandmother    Hyperlipidemia Maternal Grandmother    Arthritis Maternal Grandmother     Social History   Socioeconomic History   Marital status: Married    Spouse name: Not on file   Number of children: Not on file   Years of education: Not on file   Highest education level: Not on file  Occupational History   Not on file  Tobacco Use   Smoking status: Never   Smokeless tobacco: Never  Substance and Sexual Activity   Alcohol use: No   Drug use: No   Sexual activity: Not  Currently    Birth control/protection: None  Other Topics Concern   Not on file  Social History Narrative   Not on file   Social Determinants of Health   Financial Resource Strain: Not on file  Food Insecurity: Not on file  Transportation Needs: Not on file  Physical Activity: Not on file  Stress: Not on file  Social Connections: Not on file  Intimate Partner Violence: Not on file     PHYSICAL EXAM:  VS: BP (!) 160/82 (BP Location: Left Arm, Patient Position: Sitting, Cuff Size: Large)   Ht 5\' 9"  (1.753 m)   Wt 190 lb (86.2 kg)   BMI 28.06 kg/m  Physical Exam Gen: NAD, alert, cooperative with exam, well-appearing       ASSESSMENT & PLAN:   Radial tunnel syndrome of right upper extremity Pain is worsening despite accommodations.  Seems to have a component of radicular pain.  Seems less likely for compartment syndrome or lateral epicondylitis.  Occurring at the mid forearm. -Counseled on home exercise therapy and supportive care. -Gabapentin. -Referral to physical therapy. -Referral for nerve study. - could consider injection

## 2021-01-16 NOTE — Patient Instructions (Signed)
Good to see you Please try the gabapentin at night. You can increase to 2 or 3 times daily as you tolerate.   Please try physical therapy  I will call with the xray results.  Please send me a message in MyChart with any questions or updates.  We will setup a virtual visit once the nerve study is completed.   --Dr. Raeford Razor

## 2021-01-17 ENCOUNTER — Telehealth: Payer: Self-pay | Admitting: Family Medicine

## 2021-01-17 NOTE — Telephone Encounter (Signed)
Informed of results.   Rosemarie Ax, MD [facility] 01/17/2021, 1:23 PM

## 2021-01-29 ENCOUNTER — Ambulatory Visit: Payer: Commercial Managed Care - PPO | Admitting: Physical Therapy

## 2021-02-05 ENCOUNTER — Encounter: Payer: Self-pay | Admitting: Obstetrics & Gynecology

## 2021-02-05 ENCOUNTER — Other Ambulatory Visit (HOSPITAL_COMMUNITY)
Admission: RE | Admit: 2021-02-05 | Discharge: 2021-02-05 | Disposition: A | Discharge status: Home / Self Care | Payer: Commercial Managed Care - PPO | Source: Ambulatory Visit | Attending: Obstetrics & Gynecology | Admitting: Obstetrics & Gynecology

## 2021-02-05 ENCOUNTER — Ambulatory Visit (INDEPENDENT_AMBULATORY_CARE_PROVIDER_SITE_OTHER): Payer: Commercial Managed Care - PPO | Admitting: Obstetrics & Gynecology

## 2021-02-05 ENCOUNTER — Other Ambulatory Visit: Payer: Self-pay

## 2021-02-05 VITALS — BP 122/83 | HR 88 | Ht 69.0 in | Wt 192.0 lb

## 2021-02-05 DIAGNOSIS — Z01419 Encounter for gynecological examination (general) (routine) without abnormal findings: Secondary | ICD-10-CM | POA: Diagnosis present

## 2021-02-05 DIAGNOSIS — Z1231 Encounter for screening mammogram for malignant neoplasm of breast: Secondary | ICD-10-CM

## 2021-02-05 NOTE — Progress Notes (Signed)
Subjective:     Stephanie Delgado is a 40 y.o. female here for a routine exam. G1P1 Current complaints: Pt had a traumatic delivery at [facility] and wanted to switch physicians. She is followed by primary care for depression. She has no immediate GYN issues.   Following a c/s delivery. She had bleeding from her incision that went untreated. Her son in 57 years old now and her sx have resolved.      Gynecologic History No LMP recorded. (Menstrual status: IUD). Contraception: IUD Last Pap: 3 years prev. Results were: normal Last mammogram: n/a.   Obstetric History OB History  Gravida Para Term Preterm AB Living  '1 1 1     1  '$ SAB IAB Ectopic Multiple Live Births        0 1    # Outcome Date GA Lbr Len/2nd Weight Sex Delivery Anes PTL Lv  1 Term 01/07/18 [redacted]w[redacted]d 6 lb 8.1 oz (2.951 kg) M CS-Vac EPI  LIV     The following portions of the patient's history were reviewed and updated as appropriate: allergies, current medications, past family history, past medical history, past social history, past surgical history, and problem list.  Review of Systems Pertinent items are noted in HPI.    Objective:  BP 122/83   Pulse 88   Ht '5\' 9"'$  (1.753 m)   Wt 192 lb (87.1 kg)   BMI 28.35 kg/m   General Appearance:    Alert, cooperative, no distress, appears stated age  Head:    Normocephalic, without obvious abnormality, atraumatic  Eyes:    conjunctiva/corneas clear, EOM's intact, both eyes  Ears:    Normal external ear canals, both ears  Nose:   Nares normal, septum midline, mucosa normal, no drainage    or sinus tenderness  Throat:   Lips, mucosa, and tongue normal; teeth and gums normal  Neck:   Supple, symmetrical, trachea midline, no adenopathy;    thyroid:  no enlargement/tenderness/nodules  Back:     Symmetric, no curvature, ROM normal, no CVA tenderness  Lungs:     respirations unlabored  Chest Wall:    No tenderness or deformity   Heart:    Regular rate and rhythm  Breast Exam:    No  tenderness, masses, or nipple abnormality  Abdomen:     Soft, non-tender, bowel sounds active all four quadrants,    no masses, no organomegaly  Genitalia:    Normal female without lesion, discharge or tenderness   IUD strings noted.   Extremities:   Extremities normal, atraumatic, no cyanosis or edema  Pulses:   2+ and symmetric all extremities  Skin:   Skin color, texture, turgor normal, no rashes or lesions     Assessment:    Healthy female exam.  Breast cancer screening   Plan:   F/u PAP with [facility] F/u screening mammogram in May.  Need records from prev GYN offc.   Clever Geraldo L. Harraway-Smith, M.D., FCherlynn June

## 2021-02-07 LAB — CYTOLOGY - PAP
Comment: NEGATIVE
Diagnosis: NEGATIVE
High risk HPV: NEGATIVE

## 2021-02-08 ENCOUNTER — Encounter: Payer: Self-pay | Admitting: General Practice

## 2021-03-23 ENCOUNTER — Other Ambulatory Visit (HOSPITAL_BASED_OUTPATIENT_CLINIC_OR_DEPARTMENT_OTHER): Payer: Self-pay | Admitting: Obstetrics & Gynecology

## 2021-03-23 DIAGNOSIS — Z01419 Encounter for gynecological examination (general) (routine) without abnormal findings: Secondary | ICD-10-CM

## 2021-04-16 ENCOUNTER — Ambulatory Visit: Payer: Commercial Managed Care - PPO | Admitting: Neurology

## 2021-11-05 ENCOUNTER — Inpatient Hospital Stay (HOSPITAL_BASED_OUTPATIENT_CLINIC_OR_DEPARTMENT_OTHER): Admission: RE | Admit: 2021-11-05 | Payer: Commercial Managed Care - PPO | Source: Ambulatory Visit

## 2022-01-31 ENCOUNTER — Encounter: Payer: Self-pay | Admitting: General Practice

## 2022-10-14 ENCOUNTER — Encounter: Payer: Self-pay | Admitting: *Deleted
